# Patient Record
Sex: Male | Born: 1990 | Race: Black or African American | Hispanic: No | Marital: Married | Smoking: Never smoker
Health system: Southern US, Community
[De-identification: ages and names within clinical notes are randomized; demographics above are authoritative.]

## PROBLEM LIST (undated history)

## (undated) DIAGNOSIS — N200 Calculus of kidney: Secondary | ICD-10-CM

## (undated) HISTORY — DX: Calculus of kidney: N20.0

---

## 2019-06-22 ENCOUNTER — Ambulatory Visit: Payer: Self-pay | Attending: Internal Medicine

## 2019-07-29 ENCOUNTER — Ambulatory Visit: Payer: Self-pay | Attending: Internal Medicine

## 2019-07-29 DIAGNOSIS — Z20822 Contact with and (suspected) exposure to covid-19: Secondary | ICD-10-CM | POA: Insufficient documentation

## 2019-07-30 LAB — NOVEL CORONAVIRUS, NAA: SARS-CoV-2, NAA: NOT DETECTED

## 2019-09-21 ENCOUNTER — Other Ambulatory Visit: Payer: Self-pay

## 2019-09-21 ENCOUNTER — Ambulatory Visit (HOSPITAL_COMMUNITY)
Admission: EM | Admit: 2019-09-21 | Discharge: 2019-09-21 | Disposition: A | Discharge status: Home / Self Care | Payer: HRSA Program | Attending: Family Medicine | Admitting: Family Medicine

## 2019-09-21 ENCOUNTER — Encounter (HOSPITAL_COMMUNITY): Payer: Self-pay

## 2019-09-21 DIAGNOSIS — Z20822 Contact with and (suspected) exposure to covid-19: Secondary | ICD-10-CM | POA: Diagnosis not present

## 2019-09-21 DIAGNOSIS — R519 Headache, unspecified: Secondary | ICD-10-CM | POA: Diagnosis present

## 2019-09-21 DIAGNOSIS — Z91030 Bee allergy status: Secondary | ICD-10-CM | POA: Diagnosis not present

## 2019-09-21 DIAGNOSIS — Z880 Allergy status to penicillin: Secondary | ICD-10-CM | POA: Diagnosis not present

## 2019-09-21 DIAGNOSIS — G43011 Migraine without aura, intractable, with status migrainosus: Secondary | ICD-10-CM | POA: Diagnosis not present

## 2019-09-21 MED ORDER — DIPHENHYDRAMINE HCL 25 MG PO CAPS
25.0000 mg | ORAL_CAPSULE | Freq: Once | ORAL | Status: AC
  Administered 2019-09-21: 25 mg via ORAL

## 2019-09-21 MED ORDER — KETOROLAC TROMETHAMINE 30 MG/ML IJ SOLN
30.0000 mg | Freq: Once | INTRAMUSCULAR | Status: AC
  Administered 2019-09-21: 30 mg via INTRAMUSCULAR

## 2019-09-21 MED ORDER — DIPHENHYDRAMINE HCL 25 MG PO CAPS
ORAL_CAPSULE | ORAL | Status: AC
  Filled 2019-09-21: qty 1

## 2019-09-21 MED ORDER — KETOROLAC TROMETHAMINE 30 MG/ML IJ SOLN
INTRAMUSCULAR | Status: AC
  Filled 2019-09-21: qty 1

## 2019-09-21 NOTE — ED Triage Notes (Signed)
Pt c/o generalized HA x3 days, sinus congestion-but pt states he has a h/o "allergies'.  Pt states he has had "high blood pressure the past couple days", but reports home measurement of 147/60.

## 2019-09-21 NOTE — Discharge Instructions (Addendum)
You have a migraine headache. You have gotten Toradol 30mg  shot for pain, as well as benadryl to help with the headache.   Follow up with primary care if not feeling better by tomorrow.

## 2019-09-21 NOTE — ED Provider Notes (Signed)
[place]    CSN: [id number] Arrival date & time: 09/21/19  1812      History   Chief Complaint Chief Complaint  Patient presents with  . Headache    HPI Walter Mendoza. is a 29 y.o. male.   Patient reports that he has been having headaches for the last 3 days.  Patient denies attempting any treatments at home.  Patient reports that he has seasonal allergies.  Per chart review, patient has no significant medical history.  Patient denies history of migraines.  Denies taking any medications for these at home as well.  Patient denies cough, fever, shortness of breath, sore throat, ear pain, chest tightness, nausea, vomiting, diarrhea, rash, fever, other symptoms.  The history is provided by the patient.    History reviewed. No pertinent past medical history.  There are no problems to display for this patient.   History reviewed. No pertinent surgical history.     Home Medications    Prior to Admission medications   Medication Sig Start Date End Date Taking? Authorizing Provider  ibuprofen (ADVIL) 600 MG tablet Take 600 mg by mouth every 6 (six) hours as needed.   Yes [provider]    Family History Family History  Problem Relation Age of Onset  . Healthy Mother   . Healthy Father     Social History Social History   Tobacco Use  . Smoking status: Never Smoker  . Smokeless tobacco: Never Used  Substance Use Topics  . Alcohol use: Never  . Drug use: Never     Allergies   Bee venom and Penicillins   Review of Systems Review of Systems  Constitutional: Negative for chills, diaphoresis, fatigue and fever.  HENT: Negative for ear pain, sinus pressure, sinus pain and sore throat.   Eyes: Negative for pain and visual disturbance.  Respiratory: Negative for cough, shortness of breath and wheezing.   Cardiovascular: Negative for chest pain and palpitations.  Gastrointestinal: Negative for abdominal pain and vomiting.  Genitourinary:  Negative for dysuria and hematuria.  Musculoskeletal: Negative for arthralgias, back pain and myalgias.  Skin: Negative for color change and rash.  Neurological: Positive for headaches. Negative for dizziness, tremors, seizures, syncope, weakness, light-headedness and numbness.  Hematological: Negative.   Psychiatric/Behavioral: Negative.   All other systems reviewed and are negative.    Physical Exam Triage Vital Signs ED Triage Vitals  Enc Vitals Group     BP      Pulse      Resp      Temp      Temp src      SpO2      Weight      Height      Head Circumference      Peak Flow      Pain Score      Pain Loc      Pain Edu?      Excl. in [place]?    No data found.  Updated Vital Signs BP 135/86 (BP Location: Left Arm)   Pulse 62   Temp 99 F (37.2 C) (Oral)   Resp 18   SpO2 100%   Visual Acuity Right Eye Distance:   Left Eye Distance:   Bilateral Distance:    Right Eye Near:   Left Eye Near:    Bilateral Near:     Physical Exam Vitals and nursing note reviewed.  Constitutional:      General: He is not in acute distress.  Appearance: He is well-developed and normal weight. He is not ill-appearing.  HENT:     Head: Normocephalic and atraumatic.     Mouth/Throat:     Mouth: Mucous membranes are moist.  Eyes:     Extraocular Movements: Extraocular movements intact.     Conjunctiva/sclera: Conjunctivae normal.     Pupils: Pupils are equal, round, and reactive to light.  Cardiovascular:     Rate and Rhythm: Normal rate and regular rhythm.     Heart sounds: Normal heart sounds. No murmur.  Pulmonary:     Effort: Pulmonary effort is normal. No respiratory distress.     Breath sounds: Normal breath sounds. No stridor. No wheezing, rhonchi or rales.  Chest:     Chest wall: No tenderness.  Abdominal:     Palpations: Abdomen is soft.     Tenderness: There is no abdominal tenderness.  Musculoskeletal:        General: Normal range of motion.     Cervical back:  Normal range of motion and neck supple.  Skin:    General: Skin is warm and dry.     Capillary Refill: Capillary refill takes less than 2 seconds.  Neurological:     Mental Status: He is alert and oriented to person, place, and time.     Cranial Nerves: No cranial nerve deficit.     Sensory: No sensory deficit.     Motor: No weakness.     Coordination: Romberg sign negative. Coordination normal.     Gait: Gait normal.  Psychiatric:        Mood and Affect: Mood normal.        Speech: Speech normal.        Behavior: Behavior normal.      UC Treatments / Results  Labs (all labs ordered are listed, but only abnormal results are displayed) Labs Reviewed  SARS CORONAVIRUS 2 (TAT 6-24 HRS)    EKG   Radiology No results found.  Procedures Procedures (including critical care time)  Medications Ordered in UC Medications  ketorolac (TORADOL) 30 MG/ML injection 30 mg (30 mg Intramuscular Given 09/21/19 1920)  diphenhydrAMINE (BENADRYL) capsule 25 mg (25 mg Oral Given 09/21/19 1919)    Initial Impression / Assessment and Plan / UC Course  I have reviewed the triage vital signs and the nursing notes.  Pertinent labs & imaging results that were available during my care of the patient were reviewed by me and considered in my medical decision making (see chart for details).     Patient presents with headache for the last 3 days.  Not photophobic or phonophobic.  Pupils equal reactive to light, EOMs intact, no cranial deficit noted.  No concern for CVA today.  Likely migraine, patient received 30 mg Toradol IM in office today, along with Benadryl 25 mg.  Patient instructed to go home and rest and quiet dark room.  Patient instructed to follow-up.  This office or with primary care tomorrow in the morning if not feeling better.  Patient instructed to go to the ER for trouble swallowing, trouble breathing, unrelenting head pain, worst headache of his life, thunderclap headache, other  concerning symptoms. Final Clinical Impressions(s) / UC Diagnoses   Final diagnoses:  Intractable migraine without aura and with status migrainosus     Discharge Instructions     You have a migraine headache. You have gotten Toradol 30mg  shot for pain, as well as benadryl to help with the headache.   Follow up with primary  care if not feeling better by tomorrow.    ED Prescriptions    None     PDMP not reviewed this encounter.   Moshe Cipro, NP 09/23/19 [phone]

## 2019-09-22 LAB — SARS CORONAVIRUS 2 (TAT 6-24 HRS): SARS Coronavirus 2: NEGATIVE

## 2020-02-12 ENCOUNTER — Other Ambulatory Visit: Payer: Self-pay

## 2020-04-21 ENCOUNTER — Other Ambulatory Visit: Payer: Self-pay

## 2020-04-21 DIAGNOSIS — U071 COVID-19: Secondary | ICD-10-CM

## 2020-04-22 LAB — NOVEL CORONAVIRUS, NAA: SARS-CoV-2, NAA: NOT DETECTED

## 2020-04-22 LAB — SPECIMEN STATUS REPORT

## 2020-04-22 LAB — SARS-COV-2, NAA 2 DAY TAT

## 2020-07-12 ENCOUNTER — Encounter (HOSPITAL_COMMUNITY): Payer: Self-pay | Admitting: Emergency Medicine

## 2020-07-12 ENCOUNTER — Other Ambulatory Visit: Payer: Self-pay

## 2020-07-12 ENCOUNTER — Emergency Department (HOSPITAL_COMMUNITY): Payer: Medicaid - Out of State

## 2020-07-12 ENCOUNTER — Emergency Department (HOSPITAL_COMMUNITY)
Admission: EM | Admit: 2020-07-12 | Discharge: 2020-07-12 | Disposition: A | Discharge status: Home / Self Care | Payer: Medicaid - Out of State | Attending: Emergency Medicine | Admitting: Emergency Medicine

## 2020-07-12 DIAGNOSIS — R1013 Epigastric pain: Secondary | ICD-10-CM | POA: Diagnosis not present

## 2020-07-12 DIAGNOSIS — R112 Nausea with vomiting, unspecified: Secondary | ICD-10-CM | POA: Diagnosis present

## 2020-07-12 LAB — COMPREHENSIVE METABOLIC PANEL
ALT: 21 U/L (ref 0–44)
AST: 18 U/L (ref 15–41)
Albumin: 3.8 g/dL (ref 3.5–5.0)
Alkaline Phosphatase: 46 U/L (ref 38–126)
Anion gap: 9 (ref 5–15)
BUN: 11 mg/dL (ref 6–20)
CO2: 26 mmol/L (ref 22–32)
Calcium: 9.2 mg/dL (ref 8.9–10.3)
Chloride: 99 mmol/L (ref 98–111)
Creatinine, Ser: 1.37 mg/dL — ABNORMAL HIGH (ref 0.61–1.24)
GFR, Estimated: >60 mL/min (ref >60)
Glucose, Bld: 97 mg/dL (ref 70–99)
Potassium: 3.8 mmol/L (ref 3.5–5.1)
Sodium: 134 mmol/L — ABNORMAL LOW (ref 135–145)
Total Bilirubin: 1.1 mg/dL (ref 0.3–1.2)
Total Protein: 7.3 g/dL (ref 6.5–8.1)

## 2020-07-12 LAB — CBC WITH DIFFERENTIAL/PLATELET
Abs Immature Granulocytes: 0.03 10*3/uL (ref 0.00–0.07)
Basophils Absolute: 0 10*3/uL (ref 0.0–0.1)
Basophils Relative: 0 %
Eosinophils Absolute: 0.2 10*3/uL (ref 0.0–0.5)
Eosinophils Relative: 2 %
HCT: 43.9 % (ref 39.0–52.0)
Hemoglobin: 14.6 g/dL (ref 13.0–17.0)
Immature Granulocytes: 0 %
Lymphocytes Relative: 19 %
Lymphs Abs: 2 10*3/uL (ref 0.7–4.0)
MCH: 30.1 pg (ref 26.0–34.0)
MCHC: 33.3 g/dL (ref 30.0–36.0)
MCV: 90.5 fL (ref 80.0–100.0)
Monocytes Absolute: 0.6 10*3/uL (ref 0.1–1.0)
Monocytes Relative: 6 %
Neutro Abs: 7.8 10*3/uL — ABNORMAL HIGH (ref 1.7–7.7)
Neutrophils Relative %: 73 %
Platelets: 274 10*3/uL (ref 150–400)
RBC: 4.85 MIL/uL (ref 4.22–5.81)
RDW: 11.2 % — ABNORMAL LOW (ref 11.5–15.5)
WBC: 10.7 10*3/uL — ABNORMAL HIGH (ref 4.0–10.5)
nRBC: 0 % (ref 0.0–0.2)

## 2020-07-12 LAB — URINALYSIS, ROUTINE W REFLEX MICROSCOPIC
Bacteria, UA: NONE SEEN
Bilirubin Urine: NEGATIVE
Glucose, UA: NEGATIVE mg/dL
Hgb urine dipstick: NEGATIVE
Ketones, ur: 5 mg/dL — AB
Leukocytes,Ua: NEGATIVE
Nitrite: NEGATIVE
Protein, ur: 30 mg/dL — AB
Specific Gravity, Urine: 1.03 (ref 1.005–1.030)
pH: 5 (ref 5.0–8.0)

## 2020-07-12 LAB — LIPASE, BLOOD: Lipase: 18 U/L (ref 11–51)

## 2020-07-12 MED ORDER — IOHEXOL 300 MG/ML  SOLN
100.0000 mL | Freq: Once | INTRAMUSCULAR | Status: AC | PRN
  Administered 2020-07-12: 100 mL via INTRAVENOUS

## 2020-07-12 MED ORDER — SODIUM CHLORIDE 0.9 % IV BOLUS
1000.0000 mL | Freq: Once | INTRAVENOUS | Status: AC
  Administered 2020-07-12: 1000 mL via INTRAVENOUS

## 2020-07-12 MED ORDER — FAMOTIDINE IN NACL 20-0.9 MG/50ML-% IV SOLN
20.0000 mg | Freq: Once | INTRAVENOUS | Status: AC
  Administered 2020-07-12: 20 mg via INTRAVENOUS
  Filled 2020-07-12: qty 50

## 2020-07-12 MED ORDER — HALOPERIDOL LACTATE 5 MG/ML IJ SOLN
1.0000 mg | Freq: Once | INTRAMUSCULAR | Status: AC
  Administered 2020-07-12: 1 mg via INTRAVENOUS
  Filled 2020-07-12: qty 1

## 2020-07-12 MED ORDER — METOCLOPRAMIDE HCL 10 MG PO TABS: 10.0000 mg | ORAL_TABLET | Freq: Three times a day (TID) | ORAL | 0 refills | Status: DC | PRN

## 2020-07-12 NOTE — ED Notes (Signed)
Patient transported to CT 

## 2020-07-12 NOTE — Discharge Instructions (Signed)
I prescribed Reglan a medicine to take only for severe nausea and vomiting episodes.  Otherwise you can take the zofran you have at home.  Stick to fluids for the next 2 days - soups, smoothies, light foods.  Ease back into regular foods.  Avoid greasy foods or red meats and fast food for the next week  Call to make an appointment with a stomach specialist (GI) doctor at the number above.

## 2020-07-12 NOTE — ED Triage Notes (Signed)
Pt here from home with c/o abd pain and n/v for about 6 days , no covid exposures, same thing happened last year per pt , no fever

## 2020-07-12 NOTE — ED Provider Notes (Signed)
[facility] EMERGENCY DEPARTMENT Provider Note  CSN: [id number] Arrival date & time: 07/12/20  0841    History CC: Nausea, vomiting  Walter Huy. is a 30 y.o. male here with abdominal pain.  He reports epigastric discomfort onset 5 days ago, gnawing and sharp pain, that he has had before in the past but never this persistent.  He reports it comes and goes but never leaves completely.  Nothing makes it better or worse.  He reports some loose BM the past few days.  He is passing gas.  He has had significant nausea and recurrent vomiting in the past 2 days.    He has never had a workup for these episodes in the past.  Does not see GI for this.  Reports no sig medical history.  NKDA.  No abdominal surgical hx.  No fam hx of crohn's, IBD.  He denies marijuana or any other illicit drug use  No fevers, chills, cough, congestion, headache, sore throat.  He has not had the covid vaccines.  HPI     History reviewed. No pertinent past medical history.  There are no problems to display for this patient.   History reviewed. No pertinent surgical history.     Family History  Problem Relation Age of Onset  . Healthy Mother   . Healthy Father     Social History   Tobacco Use  . Smoking status: Never Smoker  . Smokeless tobacco: Never Used  Vaping Use  . Vaping Use: Never used  Substance Use Topics  . Alcohol use: Never  . Drug use: Never    Home Medications Prior to Admission medications   Medication Sig Start Date End Date Taking? Authorizing Provider  metoCLOPramide (REGLAN) 10 MG tablet Take 1 tablet (10 mg total) by mouth every 8 (eight) hours as needed for up to 15 doses for nausea or vomiting. 07/12/20  Yes Terald Sleeper, MD  ibuprofen (ADVIL) 600 MG tablet Take 600 mg by mouth every 6 (six) hours as needed.    [provider]    Allergies    Bee venom and Penicillins  Review of Systems   Review of Systems  Constitutional: Negative  for chills and fever.  Eyes: Negative for pain and visual disturbance.  Respiratory: Negative for cough and shortness of breath.   Cardiovascular: Negative for chest pain and palpitations.  Gastrointestinal: Positive for abdominal pain, nausea and vomiting.  Genitourinary: Negative for dysuria and hematuria.  Musculoskeletal: Negative for arthralgias and back pain.  Skin: Negative for color change and rash.  Neurological: Negative for syncope and headaches.  All other systems reviewed and are negative.   Physical Exam Updated Vital Signs BP (!) 139/99   Pulse 64   Temp 98.6 F (37 C) (Oral)   Resp (!) 22   Ht 6\' 2"  (1.88 m)   Wt 97.5 kg   SpO2 100%   BMI 27.60 kg/m   Physical Exam Constitutional:      General: He is not in acute distress. HENT:     Head: Normocephalic and atraumatic.  Eyes:     Conjunctiva/sclera: Conjunctivae normal.     Pupils: Pupils are equal, round, and reactive to light.  Cardiovascular:     Rate and Rhythm: Normal rate and regular rhythm.  Pulmonary:     Effort: Pulmonary effort is normal. No respiratory distress.  Abdominal:     General: There is no distension.     Palpations: There is no mass.  Tenderness: There is abdominal tenderness in the epigastric area. There is no guarding.     Hernia: No hernia is present.  Skin:    General: Skin is warm and dry.  Neurological:     General: No focal deficit present.     Mental Status: He is alert. Mental status is at baseline.  Psychiatric:        Mood and Affect: Mood normal.        Behavior: Behavior normal.     ED Results / Procedures / Treatments   Labs (all labs ordered are listed, but only abnormal results are displayed) Labs Reviewed  COMPREHENSIVE METABOLIC PANEL - Abnormal; Notable for the following components:      Result Value   Sodium 134 (*)    Creatinine, Ser 1.37 (*)    All other components within normal limits  CBC WITH DIFFERENTIAL/PLATELET - Abnormal; Notable for the  following components:   WBC 10.7 (*)    RDW 11.2 (*)    Neutro Abs 7.8 (*)    All other components within normal limits  URINALYSIS, ROUTINE W REFLEX MICROSCOPIC - Abnormal; Notable for the following components:   Color, Urine AMBER (*)    Ketones, ur 5 (*)    Protein, ur 30 (*)    All other components within normal limits  LIPASE, BLOOD    EKG None  Radiology CT ABDOMEN PELVIS W CONTRAST  Result Date: 07/12/2020 CLINICAL DATA:  Bowel obstruction suspected, left-sided abdominal pain with nausea and vomiting for 6 days EXAM: CT ABDOMEN AND PELVIS WITH CONTRAST TECHNIQUE: Multidetector CT imaging of the abdomen and pelvis was performed using the standard protocol following bolus administration of intravenous contrast. CONTRAST:  OMNIPAQUE IOHEXOL 300 MG/ML  SOLN COMPARISON:  None. FINDINGS: Lower chest: No acute abnormality. Hepatobiliary: No solid liver abnormality is seen. No gallstones, gallbladder wall thickening, or biliary dilatation. Pancreas: Unremarkable. No pancreatic ductal dilatation or surrounding inflammatory changes. Spleen: Normal in size without significant abnormality. Adrenals/Urinary Tract: Adrenal glands are unremarkable. Nonobstructive calculus of the posterior midportion of the right kidney. No left-sided calculi, ureteral calculi, or hydronephrosis. Bladder is unremarkable. Stomach/Bowel: Stomach is within normal limits. Appendix appears normal. No evidence of bowel wall thickening, distention, or inflammatory changes. Vascular/Lymphatic: No significant vascular findings are present. No enlarged abdominal or pelvic lymph nodes. Reproductive: No mass or other significant abnormality. Other: No abdominal wall hernia or abnormality. No abdominopelvic ascites. Musculoskeletal: No acute or significant osseous findings. IMPRESSION: 1. No acute CT findings of the abdomen or pelvis to explain left-sided abdominal pain, nausea, vomiting, or vomiting. 2. Nonobstructive right  nephrolithiasis. Electronically Signed   By: Lauralyn Primes M.D.   On: 07/12/2020 12:59    Procedures Procedures (including critical care time)  Medications Ordered in ED Medications  haloperidol lactate (HALDOL) injection 1 mg (1 mg Intravenous Given 07/12/20 1221)  famotidine (PEPCID) IVPB 20 mg premix (0 mg Intravenous Stopped 07/12/20 1326)  sodium chloride 0.9 % bolus 1,000 mL (0 mLs Intravenous Stopped 07/12/20 1343)  iohexol (OMNIPAQUE) 300 MG/ML solution 100 mL (100 mLs Intravenous Contrast Given 07/12/20 1249)    ED Course  I have reviewed the triage vital signs and the nursing notes.  Pertinent labs & imaging results that were available during my care of the patient were reviewed by me and considered in my medical decision making (see chart for details).  This patient presents to the Emergency Department with complaint of abdominal pain. This involves an extensive number of treatment  options, and is a complaint that carries with it a high risk of complications and morbidity.  The differential diagnosis includes gastritis vs constipation vs inflammatory bowel disease vs intraabdominal infection vs other  I ordered, reviewed, and interpreted labs, WBC 10.7, CMP with Cr 1.37 (no prior baseline), otherwise unremarkable.  UA without evidence of infection.  Lipase and LFT's wnl.   I ordered medication IV haldol, IV fluids, and IV pepcid for abdominal pain and/or nausea I ordered imaging studies which included CT abdomen/pelvis I independently visualized and interpreted imaging which showed no acute processes   After the interventions stated above, I reevaluated the patient and found that they remained clinically stable.  Based on the patient's clinical exam, vital signs, risk factors, and ED testing, I felt that the patient's overall risk of life-threatening emergency such as bowel perforation, surgical emergency, or sepsis was quite low.  I suspect this clinical presentation is most  consistent with gastroparesis or gastritis, but explained to the patient that this evaluation was not a definitive diagnostic workup.  I discussed outpatient follow up with primary care provider, and provided specialist office number on the patient's discharge paper if a referral was deemed necessary.  I discussed return precautions with the patient. I felt the patient was clinically stable for discharge.   Clinical Course as of 07/12/20 1648  Tue Jul 12, 2020  1304 CT reviewed -unremarkable.  Suspect this may be gastritis or gastroparesis.  Will PO challenge [MT]  1422 Pt feels significantly better, tolerated PO [MT]    Clinical Course User Index [MT] Corrigan Kretschmer, Kermit Balo, MD    Final Clinical Impression(s) / ED Diagnoses Final diagnoses:  Non-intractable vomiting with nausea, unspecified vomiting type    Rx / DC Orders ED Discharge Orders         Ordered    metoCLOPramide (REGLAN) 10 MG tablet  Every 8 hours PRN        07/12/20 1424           Terald Sleeper, MD 07/12/20 1649

## 2020-07-14 ENCOUNTER — Encounter: Payer: Self-pay | Admitting: Nurse Practitioner

## 2020-07-28 ENCOUNTER — Ambulatory Visit: Payer: Medicaid - Out of State | Admitting: Nurse Practitioner

## 2020-09-09 ENCOUNTER — Emergency Department (HOSPITAL_BASED_OUTPATIENT_CLINIC_OR_DEPARTMENT_OTHER): Payer: Medicaid - Out of State

## 2020-09-09 ENCOUNTER — Encounter (HOSPITAL_BASED_OUTPATIENT_CLINIC_OR_DEPARTMENT_OTHER): Payer: Self-pay

## 2020-09-09 ENCOUNTER — Other Ambulatory Visit: Payer: Self-pay

## 2020-09-09 ENCOUNTER — Emergency Department (HOSPITAL_BASED_OUTPATIENT_CLINIC_OR_DEPARTMENT_OTHER)
Admission: EM | Admit: 2020-09-09 | Discharge: 2020-09-10 | Disposition: A | Discharge status: Home / Self Care | Payer: Medicaid - Out of State | Attending: Emergency Medicine | Admitting: Emergency Medicine

## 2020-09-09 DIAGNOSIS — R7989 Other specified abnormal findings of blood chemistry: Secondary | ICD-10-CM

## 2020-09-09 DIAGNOSIS — R1032 Left lower quadrant pain: Secondary | ICD-10-CM | POA: Insufficient documentation

## 2020-09-09 DIAGNOSIS — N200 Calculus of kidney: Secondary | ICD-10-CM

## 2020-09-09 DIAGNOSIS — R109 Unspecified abdominal pain: Secondary | ICD-10-CM

## 2020-09-09 LAB — CBC
HCT: 42.1 % (ref 39.0–52.0)
Hemoglobin: 14.2 g/dL (ref 13.0–17.0)
MCH: 30.2 pg (ref 26.0–34.0)
MCHC: 33.7 g/dL (ref 30.0–36.0)
MCV: 89.6 fL (ref 80.0–100.0)
Platelets: 245 10*3/uL (ref 150–400)
RBC: 4.7 MIL/uL (ref 4.22–5.81)
RDW: 11.7 % (ref 11.5–15.5)
WBC: 10.7 10*3/uL — ABNORMAL HIGH (ref 4.0–10.5)
nRBC: 0 % (ref 0.0–0.2)

## 2020-09-09 LAB — URINALYSIS, ROUTINE W REFLEX MICROSCOPIC
Bilirubin Urine: NEGATIVE
Glucose, UA: NEGATIVE mg/dL
Ketones, ur: 15 mg/dL — AB
Leukocytes,Ua: NEGATIVE
Nitrite: NEGATIVE
RBC / HPF: >50 RBC/hpf — ABNORMAL HIGH (ref 0–5)
Specific Gravity, Urine: 1.022 (ref 1.005–1.030)
pH: 7 (ref 5.0–8.0)

## 2020-09-09 LAB — COMPREHENSIVE METABOLIC PANEL
ALT: 16 U/L (ref 0–44)
AST: 17 U/L (ref 15–41)
Albumin: 4.3 g/dL (ref 3.5–5.0)
Alkaline Phosphatase: 52 U/L (ref 38–126)
Anion gap: 11 (ref 5–15)
BUN: 13 mg/dL (ref 6–20)
CO2: 26 mmol/L (ref 22–32)
Calcium: 9.8 mg/dL (ref 8.9–10.3)
Chloride: 103 mmol/L (ref 98–111)
Creatinine, Ser: 1.34 mg/dL — ABNORMAL HIGH (ref 0.61–1.24)
GFR, Estimated: >60 mL/min (ref >60)
Glucose, Bld: 112 mg/dL — ABNORMAL HIGH (ref 70–99)
Potassium: 3.2 mmol/L — ABNORMAL LOW (ref 3.5–5.1)
Sodium: 140 mmol/L (ref 135–145)
Total Bilirubin: 0.4 mg/dL (ref 0.3–1.2)
Total Protein: 7.4 g/dL (ref 6.5–8.1)

## 2020-09-09 MED ORDER — MORPHINE SULFATE (PF) 4 MG/ML IV SOLN
4.0000 mg | Freq: Once | INTRAVENOUS | Status: AC
  Administered 2020-09-09: 4 mg via INTRAVENOUS
  Filled 2020-09-09: qty 1

## 2020-09-09 MED ORDER — ONDANSETRON HCL 4 MG/2ML IJ SOLN
4.0000 mg | Freq: Once | INTRAMUSCULAR | Status: AC
  Administered 2020-09-09: 4 mg via INTRAVENOUS
  Filled 2020-09-09: qty 2

## 2020-09-09 MED ORDER — SODIUM CHLORIDE 0.9 % IV BOLUS
1000.0000 mL | Freq: Once | INTRAVENOUS | Status: AC
  Administered 2020-09-09: 1000 mL via INTRAVENOUS

## 2020-09-09 NOTE — ED Provider Notes (Signed)
[facility] EMERGENCY DEPT Provider Note   CSN: [id number] Arrival date & time: 09/09/20  2217     History Chief Complaint  Patient presents with  . Abdominal Pain    Walter Mendoza. is a 30 y.o. male.  30 yo M with a chief complaints of left-sided abdominal pain.  This is in his side and his left lower quadrant.  Started suddenly while he was at work.  He tried moving his bowels and urinating but without improvement.  Nothing seems to make it better.  Nothing seems to make it worse.  No fevers no urinary symptoms.  Had something like this happen to him previously and resolved spontaneously.  Had a CT scan at that time that was unremarkable.  No history of kidney stones.  Denies pain to the penis or testicles.  The history is provided by the patient.  Abdominal Pain Pain location:  L flank Pain quality: sharp and shooting   Pain radiates to:  Does not radiate Pain severity:  Moderate Onset quality:  Sudden Duration:  2 hours Timing:  Constant Progression:  Unchanged Chronicity:  Recurrent Relieved by:  Nothing Worsened by:  Nothing Ineffective treatments:  None tried Associated symptoms: no chest pain, no chills, no diarrhea, no fever, no nausea, no shortness of breath and no vomiting        History reviewed. No pertinent past medical history.  There are no problems to display for this patient.   History reviewed. No pertinent surgical history.     Family History  Problem Relation Age of Onset  . Healthy Mother   . Healthy Father     Social History   Tobacco Use  . Smoking status: Never Smoker  . Smokeless tobacco: Never Used  Vaping Use  . Vaping Use: Never used  Substance Use Topics  . Alcohol use: Never  . Drug use: Never    Home Medications Prior to Admission medications   Medication Sig Start Date End Date Taking? Authorizing Provider  ibuprofen (ADVIL) 600 MG tablet Take 600 mg by mouth every 6 (six) hours as needed.    [provider]  metoCLOPramide (REGLAN) 10 MG tablet Take 1 tablet (10 mg total) by mouth every 8 (eight) hours as needed for up to 15 doses for nausea or vomiting. 07/12/20   Terald Sleeper, MD    Allergies    Bee venom and Penicillins  Review of Systems   Review of Systems  Constitutional: Negative for chills and fever.  HENT: Negative for congestion and facial swelling.   Eyes: Negative for discharge and visual disturbance.  Respiratory: Negative for shortness of breath.   Cardiovascular: Negative for chest pain and palpitations.  Gastrointestinal: Positive for abdominal pain. Negative for diarrhea, nausea and vomiting.  Musculoskeletal: Negative for arthralgias and myalgias.  Skin: Negative for color change and rash.  Neurological: Negative for tremors, syncope and headaches.  Psychiatric/Behavioral: Negative for confusion and dysphoric mood.    Physical Exam Updated Vital Signs BP (!) 157/94 (BP Location: Left Arm)   Pulse 71   Temp 98.6 F (37 C) (Oral)   Resp (!) 22   Ht 6\' 2"  (1.88 m)   Wt 97.5 kg   SpO2 99%   BMI 27.60 kg/m   Physical Exam Vitals and nursing note reviewed.  Constitutional:      Appearance: He is well-developed.  HENT:     Head: Normocephalic and atraumatic.  Eyes:     Pupils: Pupils are equal,  round, and reactive to light.  Neck:     Vascular: No JVD.  Cardiovascular:     Rate and Rhythm: Normal rate and regular rhythm.     Heart sounds: No murmur heard. No friction rub. No gallop.   Pulmonary:     Effort: No respiratory distress.     Breath sounds: No wheezing.  Abdominal:     General: There is no distension.     Tenderness: There is abdominal tenderness (llq). There is no guarding or rebound.  Musculoskeletal:        General: Normal range of motion.     Cervical back: Normal range of motion and neck supple.  Skin:    Coloration: Skin is not pale.     Findings: No rash.  Neurological:     Mental Status: He is alert and oriented  to person, place, and time.  Psychiatric:        Behavior: Behavior normal.     ED Results / Procedures / Treatments   Labs (all labs ordered are listed, but only abnormal results are displayed) Labs Reviewed  COMPREHENSIVE METABOLIC PANEL - Abnormal; Notable for the following components:      Result Value   Potassium 3.2 (*)    Glucose, Bld 112 (*)    Creatinine, Ser 1.34 (*)    All other components within normal limits  CBC - Abnormal; Notable for the following components:   WBC 10.7 (*)    All other components within normal limits  URINALYSIS, ROUTINE W REFLEX MICROSCOPIC    EKG None  Radiology No results found.  Procedures Procedures   Medications Ordered in ED Medications  sodium chloride 0.9 % bolus 1,000 mL (1,000 mLs Intravenous New Bag/Given 09/09/20 2306)  morphine 4 MG/ML injection 4 mg (4 mg Intravenous Given 09/09/20 2240)  ondansetron (ZOFRAN) injection 4 mg (4 mg Intravenous Given 09/09/20 2240)    ED Course  I have reviewed the triage vital signs and the nursing notes.  Pertinent labs & imaging results that were available during my care of the patient were reviewed by me and considered in my medical decision making (see chart for details).    MDM Rules/Calculators/A&P                          30 yo M with a chief complaint of left flank pain.  Started suddenly about an hour ago.  Patient looks very uncomfortable on exam.  Does have some mild left lower quadrant abdominal pain.  Will obtain a stone study.  UA.  Blood work fluids and pain medicine.  Reassess.  Signed out to Dr. Fredderick Phenix, please see her note for further details care in the ED.  The patients results and plan were reviewed and discussed.   Any x-rays performed were independently reviewed by myself.   Differential diagnosis were considered with the presenting HPI.  Medications  sodium chloride 0.9 % bolus 1,000 mL (1,000 mLs Intravenous New Bag/Given 09/09/20 2306)  morphine 4 MG/ML  injection 4 mg (4 mg Intravenous Given 09/09/20 2240)  ondansetron (ZOFRAN) injection 4 mg (4 mg Intravenous Given 09/09/20 2240)    Vitals:   09/09/20 2227 09/09/20 2228  BP: (!) 157/94   Pulse: 71   Resp: (!) 22   Temp: 98.6 F (37 C)   TempSrc: Oral   SpO2: 99%   Weight:  97.5 kg  Height:  6\' 2"  (1.88 m)    Final diagnoses:  Left flank pain      Final Clinical Impression(s) / ED Diagnoses Final diagnoses:  Left flank pain    Rx / DC Orders ED Discharge Orders    None       Melene Plan, DO 09/09/20 2312

## 2020-09-09 NOTE — ED Triage Notes (Signed)
Pt here POV from Home with ABD Pain.   Pt was at work 1-2 PTA and pain began randomly. Pain is Left-Sided. No other symptoms besides acute onset of pain. No recent trauma.   Ambulatory. GCS 15.

## 2020-09-10 MED ORDER — HYDROCODONE-ACETAMINOPHEN 5-325 MG PO TABS: 1.0000 | ORAL_TABLET | Freq: Four times a day (QID) | ORAL | 0 refills | Status: DC | PRN

## 2020-09-10 MED ORDER — TAMSULOSIN HCL 0.4 MG PO CAPS: 0.4000 mg | ORAL_CAPSULE | Freq: Every day | ORAL | 0 refills | Status: DC

## 2020-09-10 NOTE — ED Provider Notes (Signed)
Care was taken over from Dr. Adela Lank.  Patient presented with left flank pain.  CT scan shows a punctate stone in the distal left ureter.  There is some mild hydronephrosis.  His urinalysis does not appear infected.  His creatinine is mildly elevated.  I discussed this with him and that he will need to follow-up with a primary care doctor regarding this.  His other labs are nonconcerning.  He was discharged home in good condition.  He was given a prescription for Vicodin and Flomax.  He was given a referral to follow-up with urology.  Return precautions were given.  Results for orders placed or performed during the hospital encounter of 09/09/20  Comprehensive metabolic panel  Result Value Ref Range   Sodium 140 135 - 145 mmol/L   Potassium 3.2 (L) 3.5 - 5.1 mmol/L   Chloride 103 98 - 111 mmol/L   CO2 26 22 - 32 mmol/L   Glucose, Bld 112 (H) 70 - 99 mg/dL   BUN 13 6 - 20 mg/dL   Creatinine, Ser 3.29 (H) 0.61 - 1.24 mg/dL   Calcium 9.8 8.9 - 51.8 mg/dL   Total Protein 7.4 6.5 - 8.1 g/dL   Albumin 4.3 3.5 - 5.0 g/dL   AST 17 15 - 41 U/L   ALT 16 0 - 44 U/L   Alkaline Phosphatase 52 38 - 126 U/L   Total Bilirubin 0.4 0.3 - 1.2 mg/dL   GFR, Estimated >84 >16 mL/min   Anion gap 11 5 - 15  CBC  Result Value Ref Range   WBC 10.7 (H) 4.0 - 10.5 K/uL   RBC 4.70 4.22 - 5.81 MIL/uL   Hemoglobin 14.2 13.0 - 17.0 g/dL   HCT 60.6 30.1 - 60.1 %   MCV 89.6 80.0 - 100.0 fL   MCH 30.2 26.0 - 34.0 pg   MCHC 33.7 30.0 - 36.0 g/dL   RDW 09.3 23.5 - 57.3 %   Platelets 245 150 - 400 K/uL   nRBC 0.0 0.0 - 0.2 %  Urinalysis, Routine w reflex microscopic Urine, Clean Catch  Result Value Ref Range   Color, Urine YELLOW YELLOW   APPearance CLEAR CLEAR   Specific Gravity, Urine 1.022 1.005 - 1.030   pH 7.0 5.0 - 8.0   Glucose, UA NEGATIVE NEGATIVE mg/dL   Hgb urine dipstick LARGE (A) NEGATIVE   Bilirubin Urine NEGATIVE NEGATIVE   Ketones, ur 15 (A) NEGATIVE mg/dL   Protein, ur TRACE (A) NEGATIVE mg/dL    Nitrite NEGATIVE NEGATIVE   Leukocytes,Ua NEGATIVE NEGATIVE   RBC / HPF >50 (H) 0 - 5 RBC/hpf   WBC, UA 6-10 0 - 5 WBC/hpf   Mucus PRESENT    Hyaline Casts, UA PRESENT    CT Renal Stone Study  Result Date: 09/09/2020 CLINICAL DATA:  Left-sided flank pain. EXAM: CT ABDOMEN AND PELVIS WITHOUT CONTRAST TECHNIQUE: Multidetector CT imaging of the abdomen and pelvis was performed following the standard protocol without IV contrast. COMPARISON:  CT 07/12/2020 FINDINGS: Lower chest: Lung bases are clear. No consolidation or pleural fluid. Hepatobiliary: No focal liver abnormality is seen. No gallstones, gallbladder wall thickening, or biliary dilatation. Pancreas: No ductal dilatation or inflammation. Spleen: Normal in size without focal abnormality. Adrenals/Urinary Tract: Normal adrenal glands. Mild left hydroureteronephrosis and perinephric edema. There is a punctate obstructing stone in the distal left ureter, series 2, image 73 and series 5, image 41. No definite additional nonobstructing stones in the left kidney. No right hydronephrosis. Nonobstructing 6  mm stone in the lower right kidney. Urinary bladder is nearly empty. No bladder stone. Stomach/Bowel: Unenhanced stomach is unremarkable. There is no small bowel obstruction or inflammation. High-riding cecum in the right mid abdomen. Appendix not confidently visualized on the current exam. No evidence of appendicitis. Small volume of colonic stool. No colonic inflammation. Vascular/Lymphatic: Normal caliber abdominal aorta. No bulky abdominopelvic adenopathy. Reproductive: Prostate is unremarkable. Other: No free air or free fluid. No significant abdominal wall hernia. Musculoskeletal: There are no acute or suspicious osseous abnormalities. Mild chronic bony fragmentation of the pubic symphysis. IMPRESSION: 1. Punctate obstructing stone in the distal left ureter with mild hydronephrosis and perinephric edema. 2. Nonobstructing 6 mm stone in the lower right  kidney. Electronically Signed   By: Narda Rutherford M.D.   On: 09/09/2020 23:09      Rolan Bucco, MD 09/10/20 [phone]

## 2020-09-10 NOTE — Discharge Instructions (Signed)
Follow-up with urologist if your symptoms are not improving within the next few days.  Return here as needed if you have any worsening symptoms including worsening pain, fevers, vomiting, difficulty urinating or other worsening symptoms.  Your creatinine was mildly elevated and needs to be followed by a primary care doctor.

## 2021-03-04 ENCOUNTER — Emergency Department (HOSPITAL_BASED_OUTPATIENT_CLINIC_OR_DEPARTMENT_OTHER)
Admission: EM | Admit: 2021-03-04 | Discharge: 2021-03-04 | Disposition: A | Discharge status: Home / Self Care | Payer: Medicaid - Out of State | Attending: Emergency Medicine | Admitting: Emergency Medicine

## 2021-03-04 ENCOUNTER — Encounter (HOSPITAL_BASED_OUTPATIENT_CLINIC_OR_DEPARTMENT_OTHER): Payer: Self-pay | Admitting: Emergency Medicine

## 2021-03-04 ENCOUNTER — Other Ambulatory Visit: Payer: Self-pay

## 2021-03-04 DIAGNOSIS — Z28310 Unvaccinated for covid-19: Secondary | ICD-10-CM | POA: Insufficient documentation

## 2021-03-04 DIAGNOSIS — M7918 Myalgia, other site: Secondary | ICD-10-CM | POA: Diagnosis present

## 2021-03-04 DIAGNOSIS — U071 COVID-19: Secondary | ICD-10-CM | POA: Insufficient documentation

## 2021-03-04 LAB — RESP PANEL BY RT-PCR (FLU A&B, COVID) ARPGX2
Influenza A by PCR: NEGATIVE
Influenza B by PCR: NEGATIVE
SARS Coronavirus 2 by RT PCR: POSITIVE — AB

## 2021-03-04 NOTE — ED Triage Notes (Signed)
Pt reports subjective fever last night, left lower back pain ,severe. Endorses body aches.

## 2021-03-04 NOTE — ED Notes (Signed)
Dr. Rush Landmark aware that pt is covid positive

## 2021-03-04 NOTE — Discharge Instructions (Addendum)
You have tested POSITIVE for COVID 19 today. It is recommended that you self isolate for the next 5 days. Cleared: 09/15. Afterwards you should wear a mask for an additional 5 days when you are around other individuals.   I would recommend Ibuprofen and Tylenol as needed for pain and fevers. Drink plenty of fluids to stay hydrated. Rest as much as possible.   Return to the ED for any new/worsening symptoms including severe shortness of breath, chest pain, fingers/lips turning blue, passing out, or any other new/concerning symptoms.

## 2021-03-04 NOTE — ED Provider Notes (Signed)
[facility] EMERGENCY DEPT Provider Note   CSN: [id number] Arrival date & time: 03/04/21  1803     History Chief Complaint  Patient presents with   Generalized Body Aches    Walter Mendoza. is a 30 y.o. male who presents to the ED today with complaint of generalized body aches, low back pain, headache, subjective fevers that began last night.  Patient denies any recent sick contacts.  He is unvaccinated for COVID-19.  He has not taken anything for his symptoms.  He decided to come to the ED for further evaluation.  He denies any shortness of breath, chest pain, abdominal pain, nausea, vomiting, diarrhea, any other associated symptoms.  The history is provided by the patient and medical records.      History reviewed. No pertinent past medical history.  There are no problems to display for this patient.   History reviewed. No pertinent surgical history.     Family History  Problem Relation Age of Onset   Healthy Mother    Healthy Father     Social History   Tobacco Use   Smoking status: Never   Smokeless tobacco: Never  Vaping Use   Vaping Use: Never used  Substance Use Topics   Alcohol use: Never   Drug use: Never    Home Medications Prior to Admission medications   Medication Sig Start Date End Date Taking? Authorizing Provider  HYDROcodone-acetaminophen (NORCO/VICODIN) 5-325 MG tablet Take 1-2 tablets by mouth every 6 (six) hours as needed. 09/10/20   Rolan Bucco, MD  ibuprofen (ADVIL) 600 MG tablet Take 600 mg by mouth every 6 (six) hours as needed.    [provider]  metoCLOPramide (REGLAN) 10 MG tablet Take 1 tablet (10 mg total) by mouth every 8 (eight) hours as needed for up to 15 doses for nausea or vomiting. 07/12/20   Terald Sleeper, MD  tamsulosin (FLOMAX) 0.4 MG CAPS capsule Take 1 capsule (0.4 mg total) by mouth daily. 09/10/20   Rolan Bucco, MD    Allergies    Bee venom and Penicillins  Review of Systems   Review  of Systems  Constitutional:  Positive for chills, fatigue and fever.  Respiratory:  Positive for cough. Negative for shortness of breath.   Cardiovascular:  Negative for chest pain.  Gastrointestinal:  Negative for diarrhea, nausea and vomiting.  Musculoskeletal:  Positive for myalgias.  Neurological:  Positive for headaches.  All other systems reviewed and are negative.  Physical Exam Updated Vital Signs BP 122/74   Pulse 87   Temp 99.4 F (37.4 C) (Oral)   Resp 20   SpO2 100%   Physical Exam Vitals and nursing note reviewed.  Constitutional:      Appearance: He is not ill-appearing or diaphoretic.  HENT:     Head: Normocephalic and atraumatic.  Eyes:     Conjunctiva/sclera: Conjunctivae normal.  Cardiovascular:     Rate and Rhythm: Normal rate and regular rhythm.     Pulses: Normal pulses.  Pulmonary:     Effort: Pulmonary effort is normal.     Breath sounds: Normal breath sounds. No wheezing, rhonchi or rales.  Abdominal:     Tenderness: There is no abdominal tenderness. There is no guarding or rebound.  Skin:    General: Skin is warm and dry.     Coloration: Skin is not jaundiced.  Neurological:     Mental Status: He is alert.    ED Results / Procedures / Treatments  Labs (all labs ordered are listed, but only abnormal results are displayed) Labs Reviewed  RESP PANEL BY RT-PCR (FLU A&B, COVID) ARPGX2 - Abnormal; Notable for the following components:      Result Value   SARS Coronavirus 2 by RT PCR POSITIVE (*)    All other components within normal limits    EKG None  Radiology No results found.  Procedures Procedures   Medications Ordered in ED Medications - No data to display  ED Course  I have reviewed the triage vital signs and the nursing notes.  Pertinent labs & imaging results that were available during my care of the patient were reviewed by me and considered in my medical decision making (see chart for details).    MDM  Rules/Calculators/A&P                           30 year old male who presents to the ED today with complaint of COVID-like symptoms that began last night.  Unvaccinated.  On arrival to the ED patient is slightly elevated temp at 99.4.  Nontachycardic, nontachypneic, satting 100% on room air.  He did have a COVID and flu test done while in the waiting room which has returned positive for COVID-19.  On my exam he is resting comfortably, laying in a darkened room.  He appears to be in no acute distress.  He speaking full sentences without difficulty.  His lungs are clear to auscultation bilaterally.  He denies any vomiting or diarrhea.  He denies any shortness of breath.  I do not feel he needs further work-up at this time.  He is an otherwise healthy 30 year old that does not qualify for oral antiviral medication.  Have recommended ibuprofen and Tylenol as needed for pain, rest, increase fluid intake.  He is in agreement with plan.  Stable for discharge   This note was prepared using Dragon voice recognition software and may include unintentional dictation errors due to the inherent limitations of voice recognition software.   Final Clinical Impression(s) / ED Diagnoses Final diagnoses:  COVID-19    Rx / DC Orders ED Discharge Orders     None        Discharge Instructions      You have tested POSITIVE for COVID 19 today. It is recommended that you self isolate for the next 5 days. Cleared: 09/15. Afterwards you should wear a mask for an additional 5 days when you are around other individuals.   I would recommend Ibuprofen and Tylenol as needed for pain and fevers. Drink plenty of fluids to stay hydrated. Rest as much as possible.   Return to the ED for any new/worsening symptoms including severe shortness of breath, chest pain, fingers/lips turning blue, passing out, or any other new/concerning symptoms.        Tanda Rockers, PA-C 03/04/21 2046    Tegeler, Canary Brim,  MD 03/04/21 2113

## 2021-04-26 ENCOUNTER — Other Ambulatory Visit: Payer: Self-pay

## 2021-04-26 ENCOUNTER — Emergency Department (HOSPITAL_BASED_OUTPATIENT_CLINIC_OR_DEPARTMENT_OTHER): Payer: Medicaid - Out of State | Admitting: Radiology

## 2021-04-26 ENCOUNTER — Encounter (HOSPITAL_BASED_OUTPATIENT_CLINIC_OR_DEPARTMENT_OTHER): Payer: Self-pay | Admitting: Obstetrics and Gynecology

## 2021-04-26 ENCOUNTER — Emergency Department (HOSPITAL_BASED_OUTPATIENT_CLINIC_OR_DEPARTMENT_OTHER)
Admission: EM | Admit: 2021-04-26 | Discharge: 2021-04-26 | Disposition: A | Discharge status: Home / Self Care | Payer: Medicaid - Out of State | Attending: Emergency Medicine | Admitting: Emergency Medicine

## 2021-04-26 ENCOUNTER — Emergency Department (HOSPITAL_BASED_OUTPATIENT_CLINIC_OR_DEPARTMENT_OTHER): Payer: Medicaid - Out of State

## 2021-04-26 DIAGNOSIS — R0602 Shortness of breath: Secondary | ICD-10-CM

## 2021-04-26 DIAGNOSIS — Z20822 Contact with and (suspected) exposure to covid-19: Secondary | ICD-10-CM | POA: Diagnosis not present

## 2021-04-26 DIAGNOSIS — J181 Lobar pneumonia, unspecified organism: Secondary | ICD-10-CM | POA: Diagnosis not present

## 2021-04-26 DIAGNOSIS — J189 Pneumonia, unspecified organism: Secondary | ICD-10-CM

## 2021-04-26 LAB — COMPREHENSIVE METABOLIC PANEL
ALT: 24 U/L (ref 0–44)
AST: 23 U/L (ref 15–41)
Albumin: 4.1 g/dL (ref 3.5–5.0)
Alkaline Phosphatase: 48 U/L (ref 38–126)
Anion gap: 7 (ref 5–15)
BUN: 12 mg/dL (ref 6–20)
CO2: 26 mmol/L (ref 22–32)
Calcium: 9.3 mg/dL (ref 8.9–10.3)
Chloride: 103 mmol/L (ref 98–111)
Creatinine, Ser: 1.19 mg/dL (ref 0.61–1.24)
GFR, Estimated: >60 mL/min (ref >60)
Glucose, Bld: 92 mg/dL (ref 70–99)
Potassium: 4 mmol/L (ref 3.5–5.1)
Sodium: 136 mmol/L (ref 135–145)
Total Bilirubin: 0.7 mg/dL (ref 0.3–1.2)
Total Protein: 7.6 g/dL (ref 6.5–8.1)

## 2021-04-26 LAB — CBC WITH DIFFERENTIAL/PLATELET
Abs Immature Granulocytes: 0.03 10*3/uL (ref 0.00–0.07)
Basophils Absolute: 0.1 10*3/uL (ref 0.0–0.1)
Basophils Relative: 1 %
Eosinophils Absolute: 0.4 10*3/uL (ref 0.0–0.5)
Eosinophils Relative: 3 %
HCT: 42.9 % (ref 39.0–52.0)
Hemoglobin: 14.5 g/dL (ref 13.0–17.0)
Immature Granulocytes: 0 %
Lymphocytes Relative: 9 %
Lymphs Abs: 1.1 10*3/uL (ref 0.7–4.0)
MCH: 30.9 pg (ref 26.0–34.0)
MCHC: 33.8 g/dL (ref 30.0–36.0)
MCV: 91.3 fL (ref 80.0–100.0)
Monocytes Absolute: 0.6 10*3/uL (ref 0.1–1.0)
Monocytes Relative: 5 %
Neutro Abs: 10.1 10*3/uL — ABNORMAL HIGH (ref 1.7–7.7)
Neutrophils Relative %: 82 %
Platelets: 253 10*3/uL (ref 150–400)
RBC: 4.7 MIL/uL (ref 4.22–5.81)
RDW: 11.7 % (ref 11.5–15.5)
WBC: 12.3 10*3/uL — ABNORMAL HIGH (ref 4.0–10.5)
nRBC: 0 % (ref 0.0–0.2)

## 2021-04-26 LAB — RESP PANEL BY RT-PCR (FLU A&B, COVID) ARPGX2
Influenza A by PCR: NEGATIVE
Influenza B by PCR: NEGATIVE
SARS Coronavirus 2 by RT PCR: NEGATIVE

## 2021-04-26 MED ORDER — ACETAMINOPHEN 325 MG PO TABS
650.0000 mg | ORAL_TABLET | Freq: Once | ORAL | Status: AC
  Administered 2021-04-26: 650 mg via ORAL
  Filled 2021-04-26: qty 2

## 2021-04-26 MED ORDER — DOXYCYCLINE HYCLATE 100 MG PO CAPS: 100.0000 mg | ORAL_CAPSULE | Freq: Two times a day (BID) | ORAL | 0 refills | Status: AC

## 2021-04-26 MED ORDER — SODIUM CHLORIDE 0.9 % IV BOLUS
1000.0000 mL | Freq: Once | INTRAVENOUS | Status: AC
  Administered 2021-04-26: 1000 mL via INTRAVENOUS

## 2021-04-26 MED ORDER — ALBUTEROL SULFATE HFA 108 (90 BASE) MCG/ACT IN AERS: 2.0000 | INHALATION_SPRAY | RESPIRATORY_TRACT | Status: DC | PRN

## 2021-04-26 MED ORDER — IOHEXOL 350 MG/ML SOLN
75.0000 mL | Freq: Once | INTRAVENOUS | Status: AC | PRN
  Administered 2021-04-26: 75 mL via INTRAVENOUS

## 2021-04-26 NOTE — ED Triage Notes (Signed)
Patient reports to the ER for Shortness of breath. Patient states it started yesterday and has gotten progressively worse. Patient endorses some mild coughing.

## 2021-04-26 NOTE — ED Notes (Signed)
Pt felt winded after walking back from Xray to room 7. Upon return pt was 100%, Hr 101 but decreased to a HR of 93.

## 2021-04-26 NOTE — Discharge Instructions (Addendum)
We discussed results of your CT scan at length, you are provided with a copy of these.  He will go home on a short course of antibiotics to help treat your community-acquired pneumonia.  Please take 1 tablet twice a day for the next 5 days.  If you experience any worsening chest pain, worsening shortness of breath you will need to return to the emergency department.  The [facility] is attached to your chart, once you complete antibiotics you will need a follow-up x-ray.

## 2021-04-26 NOTE — ED Notes (Signed)
Pt states he thinks chest pain is from coughing. S1 S2 clear. Lung sounds clear, Cap refill wdl

## 2021-04-26 NOTE — ED Provider Notes (Signed)
[facility] EMERGENCY DEPT Provider Note   CSN: [id number] Arrival date & time: 04/26/21  1122     History Chief Complaint  Patient presents with   Shortness of Breath    Walter Searl. is a 30 y.o. male.  30 y.o male with no PMH presents to the ED with a chief complaint of shortness of breath x yesterday.  Patient reports his symptoms began yesterday, when he was walking down and up the stairs and noticed himself to be very winded.  He also endorses a productive cough with some yellow sputum, states his girlfriend gave him some " pill for the cough ", which he took yesterday and today without any improvement in symptoms.  He does endorse some pain along the right side of his chest, this has been ongoing for the last couple days.  Of note, patient did have a COVID-19 infection in the month of September.  He does not have any prior history of COVID-19 or influenza immunizations.  He denies any tobacco use, prior history of CAD, family history of CAD, prior history of blood clots.    The history is provided by the patient and medical records.  Shortness of Breath Severity:  Moderate Associated symptoms: chest pain and fever   Associated symptoms: no abdominal pain, no sore throat and no vomiting       History reviewed. No pertinent past medical history.  There are no problems to display for this patient.   History reviewed. No pertinent surgical history.     Family History  Problem Relation Age of Onset   Healthy Mother    Healthy Father     Social History   Tobacco Use   Smoking status: Never   Smokeless tobacco: Never  Vaping Use   Vaping Use: Never used  Substance Use Topics   Alcohol use: Never   Drug use: Not Currently    Home Medications Prior to Admission medications   Medication Sig Start Date End Date Taking? Authorizing Provider  doxycycline (VIBRAMYCIN) 100 MG capsule Take 1 capsule (100 mg total) by mouth 2 (two) times daily for 5  days. 04/26/21 05/01/21 Yes Efrain Clauson, Beverley Fiedler, PA-C  HYDROcodone-acetaminophen (NORCO/VICODIN) 5-325 MG tablet Take 1-2 tablets by mouth every 6 (six) hours as needed. 09/10/20   Malvin Johns, MD  ibuprofen (ADVIL) 600 MG tablet Take 600 mg by mouth every 6 (six) hours as needed.    [provider]  metoCLOPramide (REGLAN) 10 MG tablet Take 1 tablet (10 mg total) by mouth every 8 (eight) hours as needed for up to 15 doses for nausea or vomiting. 07/12/20   Wyvonnia Dusky, MD  tamsulosin (FLOMAX) 0.4 MG CAPS capsule Take 1 capsule (0.4 mg total) by mouth daily. 09/10/20   Malvin Johns, MD    Allergies    Bee venom and Penicillins  Review of Systems   Review of Systems  Constitutional:  Positive for chills and fever.  HENT:  Positive for rhinorrhea. Negative for sore throat.   Respiratory:  Positive for shortness of breath.   Cardiovascular:  Positive for chest pain. Negative for leg swelling.  Gastrointestinal:  Negative for abdominal pain, nausea and vomiting.  Genitourinary:  Negative for flank pain.  Musculoskeletal:  Negative for back pain.  Neurological:  Negative for light-headedness and numbness.  All other systems reviewed and are negative.  Physical Exam Updated Vital Signs BP 137/89 (BP Location: Left Arm)   Pulse 76   Temp 100.3 F (37.9 C)  Resp (!) 21   SpO2 100%   Physical Exam Vitals and nursing note reviewed.  Constitutional:      Appearance: He is ill-appearing.  HENT:     Head: Normocephalic and atraumatic.  Cardiovascular:     Rate and Rhythm: Normal rate.     Comments: Range between 90's to 100s HR Pulmonary:     Effort: Pulmonary effort is normal. No tachypnea.     Breath sounds: Examination of the right-lower field reveals decreased breath sounds. Examination of the left-lower field reveals decreased breath sounds. Decreased breath sounds present.  Chest:     Chest wall: No tenderness.  Musculoskeletal:     Cervical back: Normal range of  motion and neck supple.     Right lower leg: No tenderness. No edema.     Left lower leg: No tenderness. No edema.  Skin:    General: Skin is warm and dry.  Neurological:     Mental Status: He is alert and oriented to person, place, and time.    ED Results / Procedures / Treatments   Labs (all labs ordered are listed, but only abnormal results are displayed) Labs Reviewed  CBC WITH DIFFERENTIAL/PLATELET - Abnormal; Notable for the following components:      Result Value   WBC 12.3 (*)    Neutro Abs 10.1 (*)    All other components within normal limits  RESP PANEL BY RT-PCR (FLU A&B, COVID) ARPGX2  COMPREHENSIVE METABOLIC PANEL    EKG EKG Interpretation  Date/Time:  Wednesday April 26 2021 11:32:31 EDT Ventricular Rate:  85 PR Interval:  152 QRS Duration: 90 QT Interval:  342 QTC Calculation: 406 R Axis:   67 Text Interpretation: Normal sinus rhythm Possible Left atrial enlargement Left ventricular hypertrophy Abnormal ECG No old tracing to compare Confirmed by Calvert Cantor [phone]) on 04/26/2021 11:37:46 AM  Radiology DG Chest 2 View  Result Date: 04/26/2021 CLINICAL DATA:  Shortness of breath. EXAM: CHEST - 2 VIEW COMPARISON:  None. FINDINGS: Normal heart, mediastinum and hila. Clear lungs.  No pleural effusion or pneumothorax. Skeletal structures are unremarkable. IMPRESSION: Normal chest radiographs. Electronically Signed   By: Lajean Manes M.D.   On: 04/26/2021 11:43   CT Angio Chest PE W and/or Wo Contrast  Result Date: 04/26/2021 CLINICAL DATA:  Chest pain with shortness of breath pleurisy. History of COVID September 2022 EXAM: CT ANGIOGRAPHY CHEST WITH CONTRAST TECHNIQUE: Multidetector CT imaging of the chest was performed using the standard protocol during bolus administration of intravenous contrast. Multiplanar CT image reconstructions and MIPs were obtained to evaluate the vascular anatomy. CONTRAST:  81mL OMNIPAQUE IOHEXOL 350 MG/ML SOLN COMPARISON:   04/26/2021 chest x-ray FINDINGS: Cardiovascular: Intact thoracic aorta. Patent 3 vessel arch anatomy. No aneurysm or dissection. No mediastinal hemorrhage or hematoma. Central pulmonary arteries are patent and normal in caliber. No significant central or proximal hilar pulmonary embolus. Limited assessment of the small basilar peripheral segmental branches. Normal heart size.  No pericardial effusion. Central venous structures appear patent.  No veno-occlusive process. Mediastinum/Nodes: Thyroid unremarkable. Trachea and central airways are patent. Esophagus nondilated. Mildly prominent prevascular and hilar lymph nodes but no bulky adenopathy. Lungs/Pleura: Minor focal posterior ground-glass opacity in the right upper lobe along the fissure, images 45 through 51/series 6. This measures up to 13 mm. This nonspecific ground-glass focal opacity is nonspecific for ground-glass nodule or infectious/inflammatory process. Otherwise lungs are clear. Minor atelectasis or scarring in the inferior right middle lobe. No other pleural abnormality, effusion,  or pneumothorax. Upper Abdomen: No acute abnormality. Musculoskeletal: No chest wall abnormality. No acute or significant osseous findings. Review of the MIP images confirms the above findings. IMPRESSION: Negative for significant acute pulmonary embolus by CTA. No other acute intrathoracic vascular or significant non vascular finding. Right upper lobe posterior subpleural ground-glass nodule measuring up to 13 mm. Initial follow-up with CT at 6-12 months is recommended to confirm persistence. If persistent, repeat CT is recommended every 2 years until 5 years of stability has been established. This recommendation follows the consensus statement: Guidelines for Management of Incidental Pulmonary Nodules Detected on CT Images: From the Fleischner Society 2017; Radiology 2017; 284:228-243. Electronically Signed   By: Jerilynn Mages.  Shick M.D.   On: 04/26/2021 15:11     Procedures Procedures   Medications Ordered in ED Medications  albuterol (VENTOLIN HFA) 108 (90 Base) MCG/ACT inhaler 2 puff (has no administration in time range)  acetaminophen (TYLENOL) tablet 650 mg (650 mg Oral Given 04/26/21 1135)  sodium chloride 0.9 % bolus 1,000 mL (1,000 mLs Intravenous New Bag/Given 04/26/21 1249)  iohexol (OMNIPAQUE) 350 MG/ML injection 75 mL (75 mLs Intravenous Contrast Given 04/26/21 1449)    ED Course  I have reviewed the triage vital signs and the nursing notes.  Pertinent labs & imaging results that were available during my care of the patient were reviewed by me and considered in my medical decision making (see chart for details).    MDM Rules/Calculators/A&P   Patient presents to the ED with a chief complaint of shortness of breath that began yesterday, he first noted this when he was going up and down the stairs and felt like he was very out of breath.  In addition he does report right-sided chest pain that has been ongoing since yesterday.  This denies any prior history of immunization for influenza or COVID-19.  Of note, patient did have COVID-19 in the month of September.  He arrived in the ED with stable vital signs, oxygen saturation was around 95%, heart rate noted to be in the mid 90s to 100s, he was febrile with a temperature of 100.3, Tylenol was given on arrival and after recheck temperature improved to 99.5.  During evaluation his lungs are decreased to auscultation throughout, there is no tenderness to palpation of his abdomen.  Oropharynx is clear without any erythema or tonsillar exudate noted.  There is some nasal congestion noted on my exam.  In addition there is no swelling to bilateral legs, no pitting edema.  X-ray I obtained in triage without any signs of pneumonia, pleural effusion, pneumothorax.  EKG sinus rhythm with a rate in the 80s, without any changes significant with ST elevations or infarct.  He does not have any history of  heart disease, no hypertension, no hypercholesterolemia, nontobacco user, does not have risk factors for CAD without any family history.  Respiratory panel has been sent.  This is negative for influenza, COVID-19.  Labs were obtained as patient had increased work of breathing, slight elevation in his heart rate and there was concern for pulmonary embolism.  CBC with a slight leukocytosis of 12.3.  CMP without any electrolyte derangement, the rest of his labs are within normal limits.  CT Angio chest: Negative for significant acute pulmonary embolus by CTA.     No other acute intrathoracic vascular or significant non vascular  finding.     Right upper lobe posterior subpleural ground-glass nodule measuring  up to 13 mm.     Initial follow-up with  CT at 6-12 months is recommended to confirm  persistence. If persistent, repeat CT is recommended every 2 years  until 5 years of stability has been established. This recommendation  follows the consensus statement: Guidelines for Management of  Incidental Pulmonary Nodules Detected on CT Images: From the  Fleischner Society 2017; Radiology 2017; 284:228-243.      Results were discussed at length with patient.  He was provided with a copy of the CT for her records.  I do feel that this is multiple infections due to patient's low-grade temp, we did discuss treatment with antibiotics at this time for the next 5 days.  He is agreeable to plan and management, return precautions discussed at length.  Patient stable for discharge.  Portions of this note were generated with Scientist, clinical (histocompatibility and immunogenetics). Dictation errors may occur despite best attempts at proofreading.  Final Clinical Impression(s) / ED Diagnoses Final diagnoses:  Shortness of breath  Community acquired pneumonia of right upper lobe of lung    Rx / DC Orders ED Discharge Orders          Ordered    doxycycline (VIBRAMYCIN) 100 MG capsule  2 times daily        04/26/21 1521              Claude Manges, PA-C 04/26/21 1523    Pollyann Savoy, MD 04/27/21 1456

## 2021-04-26 NOTE — ED Notes (Signed)
Patient transported to CT 

## 2021-07-12 ENCOUNTER — Ambulatory Visit (HOSPITAL_BASED_OUTPATIENT_CLINIC_OR_DEPARTMENT_OTHER): Payer: BC Managed Care – PPO | Admitting: Family Medicine

## 2021-07-12 ENCOUNTER — Other Ambulatory Visit: Payer: Self-pay

## 2021-07-12 ENCOUNTER — Encounter (HOSPITAL_BASED_OUTPATIENT_CLINIC_OR_DEPARTMENT_OTHER): Payer: Self-pay | Admitting: Family Medicine

## 2021-07-12 VITALS — BP 132/94 | HR 67 | Ht 74.0 in | Wt 223.0 lb

## 2021-07-12 DIAGNOSIS — G8929 Other chronic pain: Secondary | ICD-10-CM

## 2021-07-12 DIAGNOSIS — M25561 Pain in right knee: Secondary | ICD-10-CM | POA: Diagnosis not present

## 2021-07-12 DIAGNOSIS — R030 Elevated blood-pressure reading, without diagnosis of hypertension: Secondary | ICD-10-CM | POA: Insufficient documentation

## 2021-07-12 DIAGNOSIS — M25562 Pain in left knee: Secondary | ICD-10-CM | POA: Diagnosis not present

## 2021-07-12 DIAGNOSIS — Z0000 Encounter for general adult medical examination without abnormal findings: Secondary | ICD-10-CM

## 2021-07-12 DIAGNOSIS — Z87442 Personal history of urinary calculi: Secondary | ICD-10-CM | POA: Diagnosis not present

## 2021-07-12 NOTE — Assessment & Plan Note (Signed)
History of kidney stone in the past, requesting referral to nephrology, referral placed today Discussed general measures, recommend maintaining adequate hydration

## 2021-07-12 NOTE — Patient Instructions (Signed)
°  Medication Instructions:  Your physician recommends that you continue on your current medications as directed. Please refer to the Current Medication list given to you today. --If you need a refill on any your medications before your next appointment, please call your pharmacy first. If no refills are authorized on file call the office.-- Lab Work: Your physician has recommended that you have lab work today: CBC, CMP, Lipid, and TSH If you have labs (blood work) drawn today and your tests are completely normal, you will receive your results via [place] a phone call from our staff.  Please ensure you check your voicemail in the event that you authorized detailed messages to be left on a delegated number. If you have any lab test that is abnormal or we need to change your treatment, we will call you to review the results.  Referrals/Procedures/Imaging: A referral has been placed for you to [facility] for evaluation and treatment. Someone from the scheduling department will be in contact with you in regards to coordinating your consultation. If you do not hear from any of the schedulers within 7-10 business days please give their office a call.  Follow-Up: Your next appointment:   Your physician recommends that you schedule a follow-up appointment in: 2-3 MONTHS for CPE with Dr. de [place]  You will receive a text message or e-mail with a link to a survey about your care and experience with [place] today! We would greatly appreciate your feedback!   Thanks for letting us be apart of your health journey!!  Primary Care and Sports Medicine   Dr. Arlina Robes [place]   We encourage you to activate your patient portal called "MyChart".  Sign up information is provided on this After Visit Summary.  MyChart is used to connect with patients for Virtual Visits (Telemedicine).  Patients are able to view lab/test results, encounter notes, upcoming appointments, etc.  Non-urgent messages can be sent to  your provider as well. To learn more about what you can do with MyChart, please visit --  [email or web address].

## 2021-07-12 NOTE — Assessment & Plan Note (Signed)
Exam largely benign today, did have some crepitus, no pain elicited Discussed general measures to help with pain control, can continue with OTC medications Could consider referral to physical therapy for further evaluation and recommendations regarding home exercise program Could also consider proceeding with x-ray imaging, patient wishes to hold off for now

## 2021-07-12 NOTE — Progress Notes (Signed)
New Patient Office Visit  Subjective:  Patient ID: Walter Mendoza., male    DOB: 01-14-1991  Age: 31 y.o. MRN: [id number]  CC:  Chief Complaint  Patient presents with   Establish Care    No prior PCP in the last 3 years   Nephrolithiasis    Patient states he has a hx of kidney stones and was referred to see nephrology last year but didn't have insurance so he never followed up on it. He request to have his creatinine reassessed today as a result   Knee Pain    Patient states he has had bilateral joint pain in his knees for the last 5 years or so. He denies any injury or origin of the pain. He admits to being very active   Hypertension    Patient states he checks his BP at home and is averaging a systolic in the 150/160 range and diastolic in the upper 90's.     HPI Walter Mendoza. is a 31 year old male presenting to establish in clinic.  He has current concerns as outlined above.  Reports history of kidney stone in the past.  Chronic bilateral knee pain: Patient has had ongoing chronic knee pain for at least 5 years.  Primarily notes pain at the conclusion of his workday.  No specific activities seem to worsen the symptoms other than typical day-to-day activities.  Will typically use OTC medications to help with pain control.  Denies any prior injuries or issues with the knees in the past.  Elevated blood pressure: Reports that occasionally he has had elevated blood pressure on readings at home.  Occasionally these readings will be in the 150s systolic.  Denies any issues with chest pain or headaches.  Does have some family history of high blood pressure.  Nephrolithiasis: Had episode in the past of flank pain and was found to have kidney stone at the time.  Patient passed that stone and has not had any issues since then.  He was referred to nephrologist, however was not able to proceed with this as he did not have insurance at that time.  He is requesting referral to nephrologist now for  further evaluation and discussion.  Patient is originally from [place], [place].  He has been living in the area here for about 3 years.  He currently works at a [employer].  Outside of work, he enjoys playing basketball.  Past Medical History:  Diagnosis Date   Kidney stones     History reviewed. No pertinent surgical history.  Family History  Problem Relation Age of Onset   Hypertension Mother    Blindness Father     Social History   Socioeconomic History   Marital status: Single    Spouse name: Not on file   Number of children: Not on file   Years of education: Not on file   Highest education level: Not on file  Occupational History   Not on file  Tobacco Use   Smoking status: Never   Smokeless tobacco: Never  Vaping Use   Vaping Use: Never used  Substance and Sexual Activity   Alcohol use: Yes    Alcohol/week: 1.0 - 2.0 standard drink    Types: 1 - 2 Cans of beer per week   Drug use: Never   Sexual activity: Yes  Other Topics Concern   Not on file  Social History Narrative   Not on file   Social Determinants of Health   Financial Resource  Strain: Not on file  Food Insecurity: Not on file  Transportation Needs: Not on file  Physical Activity: Not on file  Stress: Not on file  Social Connections: Not on file  Intimate Partner Violence: Not on file    Objective:   Today's Vitals: BP (!) 132/94    Pulse 67    Ht 6\' 2"  (1.88 m)    Wt 223 lb (101.2 kg)    SpO2 97%    BMI 28.63 kg/m   Physical Exam  31 year old male in no acute distress Cardiovascular exam with regular rate and rhythm, no murmur appreciated Lungs clear to auscultation bilaterally Bilateral knee: No obvious deformity. No effusion.  Negative patellar grind.  Positive crepitus. Full ROM for flexion and extension.  Strength 5 out of 5 for flexion and extension. Anterior drawer: Negative Posterior drawer: Negative Lachman: Negative Varus stress test: Negative Valgus stress  test: Negative McMurray's: Negative Neurovascularly intact.  No evidence of lymphatic disease.  Assessment & Plan:   Problem List Items Addressed This Visit       Other   Elevated blood pressure reading in office without diagnosis of hypertension - Primary    Blood pressure borderline in office today Recommend intermittent monitoring at home Recommend lifestyle modifications including DASH diet, regular aerobic exercise Would also recommend working towards healthy, gradual weight loss as part of lifestyle modifications Will continue to monitor blood pressure at follow-up visits      Relevant Orders   CBC with Differential/Platelet   Comprehensive metabolic panel   Lipid panel   TSH Rfx on Abnormal to Free T4   History of nephrolithiasis    History of kidney stone in the past, requesting referral to nephrology, referral placed today Discussed general measures, recommend maintaining adequate hydration      Relevant Orders   Ambulatory referral to Nephrology   Bilateral knee pain    Exam largely benign today, did have some crepitus, no pain elicited Discussed general measures to help with pain control, can continue with OTC medications Could consider referral to physical therapy for further evaluation and recommendations regarding home exercise program Could also consider proceeding with x-ray imaging, patient wishes to hold off for now      Other Visit Diagnoses     Wellness examination       Relevant Orders   CBC with Differential/Platelet   Comprehensive metabolic panel   Lipid panel   TSH Rfx on Abnormal to Free T4       Outpatient Encounter Medications as of 07/12/2021  Medication Sig   [DISCONTINUED] HYDROcodone-acetaminophen (NORCO/VICODIN) 5-325 MG tablet Take 1-2 tablets by mouth every 6 (six) hours as needed.   [DISCONTINUED] ibuprofen (ADVIL) 600 MG tablet Take 600 mg by mouth every 6 (six) hours as needed.   [DISCONTINUED] metoCLOPramide (REGLAN) 10 MG  tablet Take 1 tablet (10 mg total) by mouth every 8 (eight) hours as needed for up to 15 doses for nausea or vomiting.   [DISCONTINUED] tamsulosin (FLOMAX) 0.4 MG CAPS capsule Take 1 capsule (0.4 mg total) by mouth daily.   No facility-administered encounter medications on file as of 07/12/2021.    Follow-up: Return in about 3 months (around 10/10/2021).  We will plan to check labs in advance of upcoming wellness exam  Sarie Stall J De 10/12/2021, MD

## 2021-07-12 NOTE — Assessment & Plan Note (Signed)
Blood pressure borderline in office today Recommend intermittent monitoring at home Recommend lifestyle modifications including DASH diet, regular aerobic exercise Would also recommend working towards healthy, gradual weight loss as part of lifestyle modifications Will continue to monitor blood pressure at follow-up visits

## 2021-07-13 LAB — LIPID PANEL
Chol/HDL Ratio: 3.1 ratio (ref 0.0–5.0)
Cholesterol, Total: 272 mg/dL — ABNORMAL HIGH (ref 100–199)
HDL: 89 mg/dL (ref >39)
LDL Chol Calc (NIH): 169 mg/dL — ABNORMAL HIGH (ref 0–99)
Triglycerides: 85 mg/dL (ref 0–149)
VLDL Cholesterol Cal: 14 mg/dL (ref 5–40)

## 2021-07-13 LAB — CBC WITH DIFFERENTIAL/PLATELET
Basophils Absolute: 0.1 10*3/uL (ref 0.0–0.2)
Basos: 1 %
EOS (ABSOLUTE): 0.7 10*3/uL — ABNORMAL HIGH (ref 0.0–0.4)
Eos: 8 %
Hematocrit: 43.8 % (ref 37.5–51.0)
Hemoglobin: 14.5 g/dL (ref 13.0–17.7)
Immature Grans (Abs): 0 10*3/uL (ref 0.0–0.1)
Immature Granulocytes: 0 %
Lymphocytes Absolute: 2.2 10*3/uL (ref 0.7–3.1)
Lymphs: 26 %
MCH: 29.7 pg (ref 26.6–33.0)
MCHC: 33.1 g/dL (ref 31.5–35.7)
MCV: 90 fL (ref 79–97)
Monocytes Absolute: 0.5 10*3/uL (ref 0.1–0.9)
Monocytes: 6 %
Neutrophils Absolute: 5 10*3/uL (ref 1.4–7.0)
Neutrophils: 59 %
Platelets: 260 10*3/uL (ref 150–450)
RBC: 4.89 x10E6/uL (ref 4.14–5.80)
RDW: 12 % (ref 11.6–15.4)
WBC: 8.6 10*3/uL (ref 3.4–10.8)

## 2021-07-13 LAB — COMPREHENSIVE METABOLIC PANEL
ALT: 27 IU/L (ref 0–44)
AST: 31 IU/L (ref 0–40)
Albumin/Globulin Ratio: 1.4 (ref 1.2–2.2)
Albumin: 4.4 g/dL (ref 4.0–5.0)
Alkaline Phosphatase: 62 IU/L (ref 44–121)
BUN/Creatinine Ratio: 12 (ref 9–20)
BUN: 14 mg/dL (ref 6–20)
Bilirubin Total: 0.4 mg/dL (ref 0.0–1.2)
CO2: 23 mmol/L (ref 20–29)
Calcium: 9.8 mg/dL (ref 8.7–10.2)
Chloride: 103 mmol/L (ref 96–106)
Creatinine, Ser: 1.2 mg/dL (ref 0.76–1.27)
Globulin, Total: 3.1 g/dL (ref 1.5–4.5)
Glucose: 96 mg/dL (ref 70–99)
Potassium: 4.4 mmol/L (ref 3.5–5.2)
Sodium: 140 mmol/L (ref 134–144)
Total Protein: 7.5 g/dL (ref 6.0–8.5)
eGFR: 82 mL/min/{1.73_m2} (ref >59)

## 2021-07-13 LAB — TSH RFX ON ABNORMAL TO FREE T4: TSH: 2.91 u[IU]/mL (ref 0.450–4.500)

## 2021-08-22 ENCOUNTER — Encounter (HOSPITAL_BASED_OUTPATIENT_CLINIC_OR_DEPARTMENT_OTHER): Payer: Self-pay | Admitting: Family Medicine

## 2021-08-22 ENCOUNTER — Other Ambulatory Visit: Payer: Self-pay

## 2021-08-22 ENCOUNTER — Ambulatory Visit (HOSPITAL_BASED_OUTPATIENT_CLINIC_OR_DEPARTMENT_OTHER): Payer: BC Managed Care – PPO | Admitting: Family Medicine

## 2021-08-22 VITALS — BP 120/78 | HR 66 | Ht 74.0 in | Wt 220.0 lb

## 2021-08-22 DIAGNOSIS — R109 Unspecified abdominal pain: Secondary | ICD-10-CM | POA: Diagnosis not present

## 2021-08-22 DIAGNOSIS — R1031 Right lower quadrant pain: Secondary | ICD-10-CM

## 2021-08-22 DIAGNOSIS — Z87442 Personal history of urinary calculi: Secondary | ICD-10-CM

## 2021-08-22 LAB — POCT URINALYSIS DIPSTICK
Bilirubin, UA: NEGATIVE
Blood, UA: NEGATIVE
Glucose, UA: NEGATIVE
Ketones, UA: NEGATIVE
Leukocytes, UA: NEGATIVE
Nitrite, UA: NEGATIVE
Protein, UA: NEGATIVE
Spec Grav, UA: >=1.03 — AB (ref 1.010–1.025)
Urobilinogen, UA: 0.2 E.U./dL
pH, UA: 5.5 (ref 5.0–8.0)

## 2021-08-22 NOTE — Assessment & Plan Note (Addendum)
Patient reports that last night he began to experience right-sided flank pain. Pain was acute in onset, was at work, lifting something and felt hot and flushed at the time.  At 1 point, he felt that he was going to pass out.  He does have a history of kidney stones, had prior imaging in March 2022 related to left sided kidney stone.  CT scan also showed a 6 mm stone in the right kidney.  He has not had any associated nausea, vomiting, diarrhea.  No blood in the urine or dysuria.  No fever, however has "felt hot". On exam, mild tenderness to palpation to right-sided paraspinal muscles in lower thoracic, upper lumbar region.  Mild CVA tenderness on right side.  Normal bowel sounds, abdomen nontender, nondistended. Concern for possible nephrolithiasis contributing to current symptoms.  Also possible is acute muscle strain given history and exam Will need to rule out possible kidney stone due to potential associated complications. Will check BMP and UA Will order CT scan, renal stone study to further evaluate for kidney stone, possible obstruction, hydronephrosis We will also refer patient to urology due to history of kidney stones.  He was advised on evaluation following his visit to the emergency department in March 2022, however never arranged for follow-up Plan for follow-up with me as needed Pending results of labs and imaging, may need to discuss additional pharmacotherapy including for pain control as well as to aid in stone passage such as Flomax.  Further interventions/recommendations pending results of labs and imaging

## 2021-08-22 NOTE — Progress Notes (Signed)
° ° °  Procedures performed today:    None.  Independent interpretation of notes and tests performed by another provider:   None.  Brief History, Exam, Impression, and Recommendations:    BP 120/78    Pulse 66    Ht 6\' 2"  (1.88 m)    Wt 220 lb (99.8 kg)    SpO2 99%    BMI 28.25 kg/m   Flank pain Patient reports that last night he began to experience right-sided flank pain. Pain was acute in onset, was at work, lifting something and felt hot and flushed at the time.  At 1 point, he felt that he was going to pass out.  He does have a history of kidney stones, had prior imaging in March 2022 related to left sided kidney stone.  CT scan also showed a 6 mm stone in the right kidney.  He has not had any associated nausea, vomiting, diarrhea.  No blood in the urine or dysuria.  No fever, however has "felt hot". On exam, mild tenderness to palpation to right-sided paraspinal muscles in lower thoracic, upper lumbar region.  Mild CVA tenderness on right side.  Normal bowel sounds, abdomen nontender, nondistended. Concern for possible nephrolithiasis contributing to current symptoms.  Also possible is acute muscle strain given history and exam Will need to rule out possible kidney stone due to potential associated complications. Will check BMP and UA Will order CT scan, renal stone study to further evaluate for kidney stone, possible obstruction, hydronephrosis We will also refer patient to urology due to history of kidney stones.  He was advised on evaluation following his visit to the emergency department in March 2022, however never arranged for follow-up Plan for follow-up with me as needed Pending results of labs and imaging, may need to discuss additional pharmacotherapy including for pain control as well as to aid in stone passage such as Flomax.  Further interventions/recommendations pending results of labs and imaging  History of nephrolithiasis Will refer to urology given history of  nephrolithiasis as well as current symptoms   ___________________________________________ Annalisa Colonna de 10-30-1976, MD, ABFM, CAQSM Primary Care and Sports Medicine [facility]

## 2021-08-22 NOTE — Patient Instructions (Addendum)
°  Medication Instructions:  Your physician recommends that you continue on your current medications as directed. Please refer to the Current Medication list given to you today. --If you need a refill on any your medications before your next appointment, please call your pharmacy first. If no refills are authorized on file call the office.-- Lab Work: Your physician has recommended that you have lab work today: UA and BMET If you have labs (blood work) drawn today and your tests are completely normal, you will receive your results via [place] a phone call from our staff.  Please ensure you check your voicemail in the event that you authorized detailed messages to be left on a delegated number. If you have any lab test that is abnormal or we need to change your treatment, we will call you to review the results.  Referrals/Procedures/Imaging: Your physician recommends that your CT-scan of the kidney (renal stone study). You may have these images done at [facility]. They will contact you about scheduling once the study has been pre-authorized by your insurance. The contact information for [facility] is listed below for your convenience.   [facility] [place]  [phone]  Follow-Up: Your next appointment:   Your physician recommends that you schedule a follow-up appointment in: AS NEEDED IF SYMPTOMS WORSEN with Dr. de [place]  You will receive a text message or e-mail with a link to a survey about your care and experience with [place] today! We would greatly appreciate your feedback!   Thanks for letting us be apart of your health journey!!  Primary Care and Sports Medicine   Dr. Arlina Robes [place]   We encourage you to activate your patient portal called "MyChart".  Sign up information is provided on this After Visit Summary.  MyChart is used to connect with patients for Virtual Visits (Telemedicine).  Patients are able to view  lab/test results, encounter notes, upcoming appointments, etc.  Non-urgent messages can be sent to your provider as well. To learn more about what you can do with MyChart, please visit --  [email or web address].

## 2021-08-22 NOTE — Assessment & Plan Note (Signed)
Will refer to urology given history of nephrolithiasis as well as current symptoms

## 2021-08-23 LAB — BASIC METABOLIC PANEL
BUN/Creatinine Ratio: 14 (ref 9–20)
BUN: 18 mg/dL (ref 6–20)
CO2: 25 mmol/L (ref 20–29)
Calcium: 9.6 mg/dL (ref 8.7–10.2)
Chloride: 101 mmol/L (ref 96–106)
Creatinine, Ser: 1.25 mg/dL (ref 0.76–1.27)
Glucose: 87 mg/dL (ref 70–99)
Potassium: 4.4 mmol/L (ref 3.5–5.2)
Sodium: 138 mmol/L (ref 134–144)
eGFR: 78 mL/min/{1.73_m2} (ref >59)

## 2021-08-29 ENCOUNTER — Encounter: Payer: Self-pay | Admitting: *Deleted

## 2021-09-13 ENCOUNTER — Encounter (HOSPITAL_BASED_OUTPATIENT_CLINIC_OR_DEPARTMENT_OTHER): Payer: Self-pay | Admitting: Family Medicine

## 2021-09-21 ENCOUNTER — Other Ambulatory Visit: Payer: BC Managed Care – PPO

## 2021-09-21 ENCOUNTER — Ambulatory Visit
Admission: RE | Admit: 2021-09-21 | Discharge: 2021-09-21 | Disposition: A | Discharge status: Home / Self Care | Payer: BC Managed Care – PPO | Source: Ambulatory Visit | Attending: Family Medicine | Admitting: Family Medicine

## 2021-09-21 DIAGNOSIS — R1031 Right lower quadrant pain: Secondary | ICD-10-CM

## 2021-09-28 ENCOUNTER — Encounter: Payer: Self-pay | Admitting: Urology

## 2021-09-28 ENCOUNTER — Ambulatory Visit (INDEPENDENT_AMBULATORY_CARE_PROVIDER_SITE_OTHER): Payer: BC Managed Care – PPO | Admitting: Urology

## 2021-09-28 VITALS — BP 136/84 | HR 60 | Ht 74.0 in | Wt 218.0 lb

## 2021-09-28 DIAGNOSIS — N200 Calculus of kidney: Secondary | ICD-10-CM | POA: Diagnosis not present

## 2021-09-28 LAB — URINALYSIS, ROUTINE W REFLEX MICROSCOPIC
Bilirubin, UA: NEGATIVE
Glucose, UA: NEGATIVE
Ketones, UA: NEGATIVE
Leukocytes,UA: NEGATIVE
Nitrite, UA: NEGATIVE
Protein,UA: NEGATIVE
RBC, UA: NEGATIVE
Specific Gravity, UA: >1.03 — ABNORMAL HIGH (ref 1.005–1.030)
Urobilinogen, Ur: 0.2 mg/dL (ref 0.2–1.0)
pH, UA: 6 (ref 5.0–7.5)

## 2021-09-28 NOTE — Progress Notes (Signed)
? ?Assessment: ?1. Nephrolithiasis   ? ? ?Plan: ?I personally reviewed the CT study from 09/21/2021 showing a right lower pole calculus without evidence of obstruction.  I also reviewed his prior CT from 3/22 with results as noted below. ?I discussed options for management of the right renal calculus including observation with shockwave lithotripsy, or ureteroscopy.  At this point, he would like to monitor the stone. ?Stone prevention discussed with the patient and information provided. ?Return to office as needed. ? ?Chief Complaint:  ?Chief Complaint  ?Patient presents with  ? Nephrolithiasis  ? ? ?History of Present Illness: ? ?Walter Mendoza. is a 31 y.o. year old male who is seen in consultation from de [place], Buren Kos, MD for evaluation of nephrolithiasis.  He has had prior episodes of left sided flank pain.  He believes he has passed 2 stones from the left kidney within the past year.  CT imaging from 3/22 showed a 5.8 mm calculus in the lower right kidney and a punctate obstructing stone in the distal left ureter.  He had a recent history of right-sided flank pain.   ?CT abdomen and pelvis without contrast from 09/21/2021 shows a 5 mm right inferior renal collecting system calculus without evidence of obstruction. ?He is not having any flank pain or lower urinary tract symptoms today.  No dysuria or gross hematuria. ? ? ?Past Medical History:  ?Past Medical History:  ?Diagnosis Date  ? Kidney stones   ? ? ?Past Surgical History:  ?No past surgical history on file. ? ?Allergies:  ?Allergies  ?Allergen Reactions  ? Bee Venom Anaphylaxis  ? Penicillins Hives  ? ? ?Family History:  ?Family History  ?Problem Relation Age of Onset  ? Hypertension Mother   ? Blindness Father   ? ? ?Social History:  ?Social History  ? ?Tobacco Use  ? Smoking status: Never  ? Smokeless tobacco: Never  ?Vaping Use  ? Vaping Use: Never used  ?Substance Use Topics  ? Alcohol use: Yes  ?  Alcohol/week: 1.0 - 2.0 standard drink  ?  Types:  1 - 2 Cans of beer per week  ? Drug use: Never  ? ? ?Review of symptoms:  ?Constitutional:  Negative for unexplained weight loss, night sweats, fever, chills ?ENT:  Negative for nose bleeds, sinus pain, painful swallowing ?CV:  Negative for chest pain, shortness of breath, exercise intolerance, palpitations, loss of consciousness ?Resp:  Negative for cough, wheezing, shortness of breath ?GI:  Negative for nausea, vomiting, diarrhea, bloody stools ?GU:  Positives noted in HPI; otherwise negative for gross hematuria, dysuria, urinary incontinence ?Neuro:  Negative for seizures, poor balance, limb weakness, slurred speech ?Psych:  Negative for lack of energy, depression, anxiety ?Endocrine:  Negative for polydipsia, polyuria, symptoms of hypoglycemia (dizziness, hunger, sweating) ?Hematologic:  Negative for anemia, purpura, petechia, prolonged or excessive bleeding, use of anticoagulants  ?Allergic:  Negative for difficulty breathing or choking as a result of exposure to anything; no shellfish allergy; no allergic response (rash/itch) to materials, foods ? ?Physical exam: ?BP 136/84   Pulse 60   Ht 6\' 2"  (1.88 m)   Wt 218 lb (98.9 kg)   BMI 27.99 kg/m?  ?GENERAL APPEARANCE:  Well appearing, well developed, well nourished, NAD ?HEENT: Atraumatic, Normocephalic, oropharynx clear. ?NECK: Supple without lymphadenopathy or thyromegaly. ?LUNGS: Clear to auscultation bilaterally. ?HEART: Regular Rate and Rhythm without murmurs, gallops, or rubs. ?ABDOMEN: Soft, non-tender, No Masses. ?EXTREMITIES: Moves all extremities well.  Without clubbing, cyanosis, or edema. ?NEUROLOGIC:  Alert and oriented x 3, normal gait, CN II-XII grossly intact.  ?MENTAL STATUS:  Appropriate. ?BACK:  Non-tender to palpation.  No CVAT ?SKIN:  Warm, dry and intact.   ? ?Results: ?U/A dipstick negative ? ?

## 2021-10-10 ENCOUNTER — Ambulatory Visit (INDEPENDENT_AMBULATORY_CARE_PROVIDER_SITE_OTHER): Payer: BC Managed Care – PPO | Admitting: Family Medicine

## 2021-10-10 ENCOUNTER — Encounter (HOSPITAL_BASED_OUTPATIENT_CLINIC_OR_DEPARTMENT_OTHER): Payer: Self-pay | Admitting: Family Medicine

## 2021-10-10 VITALS — BP 122/78 | HR 62 | Temp 97.7°F | Ht 74.0 in | Wt 217.4 lb

## 2021-10-10 DIAGNOSIS — R911 Solitary pulmonary nodule: Secondary | ICD-10-CM

## 2021-10-10 DIAGNOSIS — Z0000 Encounter for general adult medical examination without abnormal findings: Secondary | ICD-10-CM | POA: Insufficient documentation

## 2021-10-10 DIAGNOSIS — K639 Disease of intestine, unspecified: Secondary | ICD-10-CM | POA: Insufficient documentation

## 2021-10-10 NOTE — Patient Instructions (Signed)
Preventive Care 21-33 Years Old, Male Preventive care refers to lifestyle choices and visits with your health care provider that can promote health and wellness. Preventive care visits are also called wellness exams. What can I expect for my preventive care visit? Counseling During your preventive care visit, your health care provider may ask about your: Medical history, including: Past medical problems. Family medical history. Current health, including: Emotional well-being. Home life and relationship well-being. Sexual activity. Lifestyle, including: Alcohol, nicotine or tobacco, and drug use. Access to firearms. Diet, exercise, and sleep habits. Safety issues such as seatbelt and bike helmet use. Sunscreen use. Work and work environment. Physical exam Your health care provider may check your: Height and weight. These may be used to calculate your BMI (body mass index). BMI is a measurement that tells if you are at a healthy weight. Waist circumference. This measures the distance around your waistline. This measurement also tells if you are at a healthy weight and may help predict your risk of certain diseases, such as type 2 diabetes and high blood pressure. Heart rate and blood pressure. Body temperature.  Vaccines are usually given at various ages, according to a schedule. Your health care provider will recommend vaccines for you based on your age, medical history, and lifestyle or other factors, such as travel or where you work. What tests do I need? Screening Your health care provider may recommend screening tests for certain conditions. This may include: Lipid and cholesterol levels. Diabetes screening. This is done by checking your blood sugar (glucose) after you have not eaten for a while (fasting). Hepatitis B test. Hepatitis C test. HIV (human immunodeficiency virus) test. STI (sexually transmitted infection) testing, if you are at risk. Talk with your health care  provider about your test results, treatment options, and if necessary, the need for more tests. Follow these instructions at home: Eating and drinking  Eat a healthy diet that includes fresh fruits and vegetables, whole grains, lean protein, and low-fat dairy products. Drink enough fluid to keep your urine pale yellow. Take vitamin and mineral supplements as recommended by your health care provider. Do not drink alcohol if your health care provider tells you not to drink. If you drink alcohol: Limit how much you have to 0-2 drinks a day. Know how much alcohol is in your drink. In the [place] one drink equals one 12 oz bottle of beer (355 mL), one 5 oz glass of wine (148 mL), or one 1 oz glass of hard liquor (44 mL). Lifestyle Brush your teeth every morning and night with fluoride toothpaste. Floss one time each day. Exercise for at least 30 minutes 5 or more days each week. Do not use any products that contain nicotine or tobacco. These products include cigarettes, chewing tobacco, and vaping devices, such as e-cigarettes. If you need help quitting, ask your health care provider. Do not use drugs. If you are sexually active, practice safe sex. Use a condom or other form of protection to prevent STIs. Find healthy ways to manage stress, such as: Meditation, yoga, or listening to music. Journaling. Talking to a trusted person. Spending time with friends and family. Minimize exposure to UV radiation to reduce your risk of skin cancer. Safety Always wear your seat belt while driving or riding in a vehicle. Do not drive: If you have been drinking alcohol. Do not ride with someone who has been drinking. If you have been using any mind-altering substances or drugs. While texting. When you are tired or   distracted. Wear a helmet and other protective equipment during sports activities. If you have firearms in your house, make sure you follow all gun safety procedures. Seek help if you have been  physically or sexually abused. What's next? Go to your health care provider once a year for an annual wellness visit. Ask your health care provider how often you should have your eyes and teeth checked. Stay up to date on all vaccines. This information is not intended to replace advice given to you by your health care provider. Make sure you discuss any questions you have with your health care provider. Document Revised: 12/07/2020 Document Reviewed: 12/07/2020 Elsevier Patient Education  2023 [employer] Inc.  

## 2021-10-10 NOTE — Assessment & Plan Note (Signed)
Recent CT renal stone with mention of lesion near expected location of ileocecal junction.  No further discussion regarding significance of this finding ?We will refer to GI for further evaluation and recommendations ?

## 2021-10-10 NOTE — Progress Notes (Signed)
?Subjective:   ? ?CC: Annual Physical Exam ? ?HPI:  ?Walter Mendoza. is a 31 y.o. presenting for annual physical ? ?I reviewed the past medical history, family history, social history, surgical history, and allergies today and no changes were needed.  Please see the problem list section below in epic for further details. ? ?Past Medical History: ?Past Medical History:  ?Diagnosis Date  ? Kidney stones   ? ?Past Surgical History: ?History reviewed. No pertinent surgical history. ?Social History: ?Social History  ? ?Socioeconomic History  ? Marital status: Single  ?  Spouse name: Not on file  ? Number of children: Not on file  ? Years of education: Not on file  ? Highest education level: Not on file  ?Occupational History  ? Not on file  ?Tobacco Use  ? Smoking status: Never  ? Smokeless tobacco: Never  ?Vaping Use  ? Vaping Use: Never used  ?Substance and Sexual Activity  ? Alcohol use: Yes  ?  Alcohol/week: 1.0 - 2.0 standard drink  ?  Types: 1 - 2 Cans of beer per week  ? Drug use: Never  ? Sexual activity: Yes  ?Other Topics Concern  ? Not on file  ?Social History Narrative  ? Not on file  ? ?Social Determinants of Health  ? ?Financial Resource Strain: Not on file  ?Food Insecurity: Not on file  ?Transportation Needs: Not on file  ?Physical Activity: Not on file  ?Stress: Not on file  ?Social Connections: Not on file  ? ?Family History: ?Family History  ?Problem Relation Age of Onset  ? Hypertension Mother   ? Blindness Father   ? ?Allergies: ?Allergies  ?Allergen Reactions  ? Bee Venom Anaphylaxis  ? Penicillins Hives  ? ?Medications: See med rec. ? ?Review of Systems: No headache, visual changes, nausea, vomiting, diarrhea, constipation, dizziness, abdominal pain, skin rash, fevers, chills, night sweats, swollen lymph nodes, weight loss, chest pain, body aches, joint swelling, muscle aches, shortness of breath, mood changes, visual or auditory hallucinations. ? ?Objective:   ? ?BP 122/78   Pulse 62   Temp  97.7 ?F (36.5 ?C)   Ht 6\' 2"  (1.88 m)   Wt 217 lb 6.4 oz (98.6 kg)   SpO2 100%   BMI 27.91 kg/m?  ? ?General: Well Developed, well nourished, and in no acute distress.  ?Neuro: Alert and oriented x3, extra-ocular muscles intact, sensation grossly intact. Cranial nerves II through XII are intact, motor, sensory, and coordinative functions are all intact. ?HEENT: Normocephalic, atraumatic, pupils equal round reactive to light, neck supple, no masses, no lymphadenopathy, thyroid nonpalpable. Oropharynx, nasopharynx, external ear canals are unremarkable. ?Skin: Warm and dry, no rashes noted.  ?Cardiac: Regular rate and rhythm, no murmurs rubs or gallops.  ?Respiratory: Clear to auscultation bilaterally. Not using accessory muscles, speaking in full sentences.  ?Abdominal: Soft, nontender, nondistended, positive bowel sounds, no masses, no organomegaly.  ?Musculoskeletal: Shoulder, elbow, wrist, hip, knee, ankle stable, and with full range of motion. ? ?Impression and Recommendations:   ? ?Wellness examination ?Routine HCM labs reviewed. HCM reviewed/discussed. Anticipatory guidance regarding healthy weight, lifestyle and choices given. ?Recommend healthy diet.  Recommend approximately 150 minutes/week of moderate intensity exercise ?Recommend regular dental and vision exams ?Always use seatbelt/lap and shoulder restraints ?Recommend using smoke alarms and checking batteries at least twice a year ?Recommend using sunscreen when outside ?Tetanus recommended - patient declined today ? ?Small bowel lesion ?Recent CT renal stone with mention of lesion near expected location of ileocecal  junction.  No further discussion regarding significance of this finding ?We will refer to GI for further evaluation and recommendations ? ?Pulmonary nodule ?Noted on prior CT scan with recommendation for follow-up CT in 6 to 12 months.  6 months will be in early May.  Will place order now for patient to arrange for imaging study in the  coming weeks ? ?Plan for follow-up in 6 months, follow-up on blood pressure ? ? ?___________________________________________ ?Sukari Grist de [place], MD, ABFM, CAQSM ?Primary Care and Sports Medicine ?[facility] [place] ?

## 2021-10-10 NOTE — Assessment & Plan Note (Signed)
Noted on prior CT scan with recommendation for follow-up CT in 6 to 12 months.  6 months will be in early May.  Will place order now for patient to arrange for imaging study in the coming weeks ?

## 2021-10-10 NOTE — Assessment & Plan Note (Signed)
Routine HCM labs reviewed. HCM reviewed/discussed. Anticipatory guidance regarding healthy weight, lifestyle and choices given. ?Recommend healthy diet.  Recommend approximately 150 minutes/week of moderate intensity exercise ?Recommend regular dental and vision exams ?Always use seatbelt/lap and shoulder restraints ?Recommend using smoke alarms and checking batteries at least twice a year ?Recommend using sunscreen when outside ?Tetanus recommended - patient declined today ?

## 2021-10-16 ENCOUNTER — Telehealth (HOSPITAL_BASED_OUTPATIENT_CLINIC_OR_DEPARTMENT_OTHER): Payer: Self-pay | Admitting: Family Medicine

## 2021-10-16 NOTE — Telephone Encounter (Signed)
Received call from [place] letting us know hat pt cannot be scheduled for ordered CT being that it needed to be ordered without contrast. Spoke with representative Miriam. Please advise. ?

## 2021-10-17 NOTE — Addendum Note (Signed)
Addended by: DE Peru, Marcy Salvo J on: 10/17/2021 10:50 AM ? ? Modules accepted: Orders ? ?

## 2021-10-17 NOTE — Telephone Encounter (Signed)
Order updated without contrast ?

## 2021-11-03 ENCOUNTER — Encounter: Payer: Self-pay | Admitting: Physician Assistant

## 2021-11-06 ENCOUNTER — Inpatient Hospital Stay: Admission: RE | Admit: 2021-11-06 | Payer: BC Managed Care – PPO | Source: Ambulatory Visit

## 2021-11-23 ENCOUNTER — Ambulatory Visit: Payer: BC Managed Care – PPO | Admitting: Physician Assistant

## 2021-11-24 ENCOUNTER — Ambulatory Visit (INDEPENDENT_AMBULATORY_CARE_PROVIDER_SITE_OTHER): Payer: Medicare Other

## 2021-11-24 ENCOUNTER — Encounter (HOSPITAL_BASED_OUTPATIENT_CLINIC_OR_DEPARTMENT_OTHER): Payer: Self-pay | Admitting: Nurse Practitioner

## 2021-11-24 ENCOUNTER — Ambulatory Visit (INDEPENDENT_AMBULATORY_CARE_PROVIDER_SITE_OTHER): Payer: BC Managed Care – PPO | Admitting: Nurse Practitioner

## 2021-11-24 VITALS — BP 125/87 | HR 65 | Ht 74.0 in | Wt 218.5 lb

## 2021-11-24 DIAGNOSIS — B354 Tinea corporis: Secondary | ICD-10-CM

## 2021-11-24 DIAGNOSIS — M25531 Pain in right wrist: Secondary | ICD-10-CM

## 2021-11-24 MED ORDER — KETOCONAZOLE 2 % EX SHAM: MEDICATED_SHAMPOO | CUTANEOUS | 0 refills | Status: DC

## 2021-11-24 MED ORDER — CLOTRIMAZOLE-BETAMETHASONE 1-0.05 % EX CREA: 1.0000 "application " | TOPICAL_CREAM | Freq: Two times a day (BID) | CUTANEOUS | 2 refills | Status: DC

## 2021-11-24 MED ORDER — PREDNISONE 20 MG PO TABS: 40.0000 mg | ORAL_TABLET | Freq: Every day | ORAL | 0 refills | Status: DC

## 2021-11-24 MED ORDER — MELOXICAM 15 MG PO TABS: 15.0000 mg | ORAL_TABLET | Freq: Every day | ORAL | 0 refills | Status: DC

## 2021-11-24 NOTE — Assessment & Plan Note (Addendum)
Pain to the right wrist and hand acute in nature with no known injury.  Strongly suspect extensor tendinitis may be present based on the symptoms.  Given the patient's previous history of fracture discussed option of x-ray.  He would like to have this done today.  Recommend immobilization and NSAIDs for 3 to 4 days then begin stretching exercises.  Bracing provided today for patient.  Recommend he wear this for several weeks while working and sleeping. Discussed with patient that he may also use ice to help relieve pain and inflammation.  Note provided for work to prevent heavy lifting for at least the next 4 days.  He may need a longer span of limitations however he is concerned that he is unable to perform his job appropriately with these limitations. Recommend he return for repeat evaluation in approximately 2 weeks with PCP for further recommendations.  He may need formal physical therapy if symptoms persist.

## 2021-11-24 NOTE — Assessment & Plan Note (Signed)
Symptoms and presentation consistent with tinea corporis.  Erythematous papular rash with coalescence present on the right rib cage.  No signs of infection or drainage present.  Recommend treatment with ketoconazole shampoo to use as body wash 3 times a week and application of topical steroid and antifungal treatment daily. If no improvement of symptoms after 2 weeks of treatment patient will follow-up with PCP for further evaluation.

## 2021-11-24 NOTE — Progress Notes (Signed)
Tollie Eth, DNP, AGNP-c Primary Care & Sports Medicine [address]  [address] [place], [place] [address] [phone]  Subjective:   Pharaoh Pio. is a 31 y.o. male presents to day for rash and right hand pain. Rash Pruritic papular rash with coalescence noted on the left rib cage Patient reports that he noticed this several days ago Does not appear to be getting worse Has not tried anything for it Does not burn, sting, drain.  No pain to touch Never had anything like this before Right hand pain Endorses sudden onset of right wrist and hand pain particularly in the medial portion of both the palmar and dorsal surface of the right hand Patient reports onset of pain yesterday with no known injury Previous injury in childhood with fracture of the wrists bilaterally He has not had any chronic complications from this He reports that he lives heavy objects for work and noticed that while lifting the wrist and hand were painful Also endorses numbness and tingling in the palmar surface of the hand without radiation into the fingertips He tells me that he feels that when he places an object in the hand the muscles become weaker however when he removes the object strength returns He tells me that he was dropping things at work last night which prompted him to come in to have this evaluated  PMH, Medications, and Allergies reviewed and updated in chart.   ROS negative except for what is listed in HPI. Objective:  BP 125/87   Pulse 65   Ht 6\' 2"  (1.88 m)   Wt 218 lb 8 oz (99.1 kg)   SpO2 98%   BMI 28.05 kg/m  Physical Exam Vitals and nursing note reviewed.  Constitutional:      Appearance: Normal appearance.  HENT:     Head: Normocephalic.  Musculoskeletal:        General: Swelling and tenderness present.     Right wrist: Tenderness present. No deformity, snuff box tenderness or crepitus. Normal pulse.     Left wrist: Normal.     Right hand: Swelling  and tenderness present. No bony tenderness. Normal range of motion. Decreased strength. Normal strength of finger abduction, thumb/finger opposition and wrist extension. Normal sensation. There is no disruption of two-point discrimination. Normal capillary refill. Normal pulse.     Left hand: Normal.       Arms:     Comments: Mild edema noted to the right dorsal surface of the hand closer to the thumb and first 2 fingers.  Negative snuffbox tenderness.  Range of motion intact.  Individual finger strength and sensation intact.  Weakness is present with prolonged application of pressure with the hand.  Skin:    General: Skin is warm and dry.     Capillary Refill: Capillary refill takes less than 2 seconds.     Findings: Rash present.          Comments: Erythematous papular rash with coalescence noted to the right rib cage.  Area approximately 10 cm tall by 6 cm wide.  No excoriation, drainage, purulence, warmth present  Neurological:     General: No focal deficit present.     Mental Status: He is alert and oriented to person, place, and time.  Psychiatric:        Mood and Affect: Mood normal.        Behavior: Behavior normal.        Thought Content: Thought content normal.  Judgment: Judgment normal.          Assessment & Plan:   Problem List Items Addressed This Visit     Tinea corporis - Primary    Symptoms and presentation consistent with tinea corporis.  Erythematous papular rash with coalescence present on the right rib cage.  No signs of infection or drainage present.  Recommend treatment with ketoconazole shampoo to use as body wash 3 times a week and application of topical steroid and antifungal treatment daily. If no improvement of symptoms after 2 weeks of treatment patient will follow-up with PCP for further evaluation.       Relevant Medications   ketoconazole (NIZORAL) 2 % shampoo   clotrimazole-betamethasone (LOTRISONE) cream   Wrist pain, acute, right    Pain  to the right wrist and hand acute in nature with no known injury.  Strongly suspect extensor tendinitis may be present based on the symptoms.  Given the patient's previous history of fracture discussed option of x-ray.  He would like to have this done today.  Recommend immobilization and NSAIDs for 3 to 4 days then begin stretching exercises.  Bracing provided today for patient.  Recommend he wear this for several weeks while working and sleeping. Discussed with patient that he may also use ice to help relieve pain and inflammation.  Note provided for work to prevent heavy lifting for at least the next 4 days.  He may need a longer span of limitations however he is concerned that he is unable to perform his job appropriately with these limitations. Recommend he return for repeat evaluation in approximately 2 weeks with PCP for further recommendations.  He may need formal physical therapy if symptoms persist.       Relevant Medications   meloxicam (MOBIC) 15 MG tablet   predniSONE (DELTASONE) 20 MG tablet   Other Relevant Orders   DG Hand Complete Right     Tollie Eth, DNP, AGNP-c 11/24/2021  5:14 PM

## 2021-11-24 NOTE — Patient Instructions (Addendum)
I have sent in a body wash and cream for the rash.  You can use the body wash three days a week and the cream you can use every day. If this does not get any better in 2 weeks, please let us know.   I will get the x-ray of the hand and we will make sure nothing is fractured.   I want you to wear the brace for at least 3-4 days and then you can only wear it when working and when you sleep.   Use ice on the wrist and hand for 20 minutes 2-3 times a day for at least 2 days.  Start the stretches in 3 days or so as tolerated.

## 2021-11-27 ENCOUNTER — Encounter (HOSPITAL_BASED_OUTPATIENT_CLINIC_OR_DEPARTMENT_OTHER): Payer: Self-pay | Admitting: Nurse Practitioner

## 2021-11-27 ENCOUNTER — Other Ambulatory Visit (HOSPITAL_BASED_OUTPATIENT_CLINIC_OR_DEPARTMENT_OTHER): Payer: Self-pay

## 2021-11-27 DIAGNOSIS — S6991XA Unspecified injury of right wrist, hand and finger(s), initial encounter: Secondary | ICD-10-CM

## 2021-12-02 ENCOUNTER — Ambulatory Visit
Admission: RE | Admit: 2021-12-02 | Discharge: 2021-12-02 | Disposition: A | Discharge status: Home / Self Care | Payer: Medicare Other | Source: Ambulatory Visit | Attending: Urgent Care | Admitting: Urgent Care

## 2021-12-02 VITALS — BP 132/77 | HR 56 | Temp 98.3°F | Resp 18

## 2021-12-02 DIAGNOSIS — G8929 Other chronic pain: Secondary | ICD-10-CM

## 2021-12-02 DIAGNOSIS — R1030 Lower abdominal pain, unspecified: Secondary | ICD-10-CM

## 2021-12-02 DIAGNOSIS — R112 Nausea with vomiting, unspecified: Secondary | ICD-10-CM | POA: Diagnosis not present

## 2021-12-02 LAB — POCT URINALYSIS DIP (MANUAL ENTRY)
Blood, UA: NEGATIVE
Glucose, UA: NEGATIVE mg/dL
Ketones, POC UA: NEGATIVE mg/dL
Leukocytes, UA: NEGATIVE
Nitrite, UA: NEGATIVE
Protein Ur, POC: 30 mg/dL — AB
Spec Grav, UA: 1.025 (ref 1.010–1.025)
Urobilinogen, UA: 1 E.U./dL
pH, UA: 5.5 (ref 5.0–8.0)

## 2021-12-02 MED ORDER — ONDANSETRON 8 MG PO TBDP: 8.0000 mg | ORAL_TABLET | Freq: Three times a day (TID) | ORAL | 0 refills | Status: DC | PRN

## 2021-12-02 NOTE — Discharge Instructions (Signed)
Make sure you push fluids drinking mostly water but mix it with Gatorade.  Try to eat light meals including soups, broths and soft foods, fruits.  You may use Zofran for your nausea and vomiting once every 8 hours.   Please return to the clinic if symptoms worsen or you start having severe abdominal pain not helped by taking Tylenol or start having bloody stools or blood in the vomit.  

## 2021-12-02 NOTE — ED Triage Notes (Addendum)
Pt c/o abd pain/ cramping, vomiting, chills. This is not a new onset for the patient. He states he has an Appt with [facility]. The pt is on his last day of taking prednisone.  Started: Wednesday  Home interventions: Pepto bismol, gatorade

## 2021-12-02 NOTE — ED Provider Notes (Signed)
[facility]   MRN: [id number] DOB: 01-18-1991  Subjective:   Walter Fussell. is a 31 y.o. male presenting for recurrent and persistent nausea with vomiting, abdominal pain and cramping.  Has had this going on for months.  Has been drinking Gatorade and using Pepto-Bismol.  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  This has been resolved.  Patient is actually supposed to see a gastroenterologist as well as there was an abnormality seen in his colon.  No bloody stools, diarrhea, constipation, personal or family history of GI disorders.  No drug, marijuana use.  Patient is actually completed a course of prednisone for tendinitis.  No current facility-administered medications for this encounter.  Current Outpatient Medications:    predniSONE (DELTASONE) 20 MG tablet, Take 2 tablets (40 mg total) by mouth daily with breakfast., Disp: 10 tablet, Rfl: 0   clotrimazole-betamethasone (LOTRISONE) cream, Apply 1 application. topically 2 (two) times daily. Apply to rash twice a day as needed., Disp: 30 g, Rfl: 2   ketoconazole (NIZORAL) 2 % shampoo, Use as body wash for rash three days a week until clear., Disp: 120 mL, Rfl: 0   meloxicam (MOBIC) 15 MG tablet, Take 1 tablet (15 mg total) by mouth daily., Disp: 30 tablet, Rfl: 0   Allergies  Allergen Reactions   Bee Venom Anaphylaxis   Penicillins Hives    Past Medical History:  Diagnosis Date   Kidney stones      History reviewed. No pertinent surgical history.  Family History  Problem Relation Age of Onset   Hypertension Mother    Blindness Father     Social History   Tobacco Use   Smoking status: Never   Smokeless tobacco: Never  Vaping Use   Vaping Use: Never used  Substance Use Topics   Alcohol use: Yes    Alcohol/week: 1.0 - 2.0 standard drink of alcohol    Types: 1 - 2 Cans of beer  per week   Drug use: Never    ROS   Objective:   Vitals: BP 132/77 (BP Location: Left Arm)   Pulse (!) 56   Temp 98.3 F (36.8 C) (Oral)   Resp 18   SpO2 97%   Physical Exam Constitutional:      General: He is not in acute distress.    Appearance: Normal appearance. He is well-developed and normal weight. He is not ill-appearing, toxic-appearing or diaphoretic.  HENT:     Right Ear: External ear normal.     Left Ear: External ear normal.     Nose: Nose normal.     Mouth/Throat:     Mouth: Mucous membranes are moist.  Eyes:     General: No scleral icterus.       Right eye: No discharge.        Left eye: No discharge.     Extraocular Movements: Extraocular movements intact.     Conjunctiva/sclera: Conjunctivae normal.  Cardiovascular:     Rate and Rhythm: Normal rate and regular rhythm.     Heart sounds: No murmur heard.    No friction rub. No gallop.  Pulmonary:     Effort: No respiratory distress.     Breath sounds: No wheezing or rales.  Abdominal:     General: Bowel sounds are increased. There is no distension.     Palpations: Abdomen is soft. There is no  mass.     Tenderness: There is abdominal tenderness in the right lower quadrant and left lower quadrant. There is no right CVA tenderness, left CVA tenderness, guarding or rebound.  Skin:    General: Skin is warm and dry.  Neurological:     Mental Status: He is alert and oriented to person, place, and time.     Results for orders placed or performed during the hospital encounter of 12/02/21 (from the past 24 hour(s))  POCT urinalysis dipstick     Status: Abnormal   Collection Time: 12/02/21  9:28 AM  Result Value Ref Range   Color, UA other (A) yellow   Clarity, UA clear clear   Glucose, UA negative negative mg/dL   Bilirubin, UA small (A) negative   Ketones, POC UA negative negative mg/dL   Spec Grav, UA 1.287 8.676 - 1.025   Blood, UA negative negative   pH, UA 5.5 5.0 - 8.0   Protein Ur, POC =30  (A) negative mg/dL   Urobilinogen, UA 1.0 0.2 or 1.0 E.U./dL   Nitrite, UA Negative Negative   Leukocytes, UA Negative Negative    Assessment and Plan :   PDMP not reviewed this encounter.  1. Lower abdominal pain   2. Nausea and vomiting, unspecified vomiting type   3. Chronic abdominal pain    Suspect acute on chronic abdominal pain with nausea and vomiting.  However, this may be impacted by his current use of prednisone.  Recommend supportive care, Zofran.  Maintain follow-up with the GI specialist.  No signs of an acute abdomen on exam.  No signs of severe dehydration warranting IV fluids. Counseled patient on potential for adverse effects with medications prescribed/recommended today, ER and return-to-clinic precautions discussed, patient verbalized understanding.    Wallis Bamberg, [place] 12/02/21 [phone]

## 2021-12-03 ENCOUNTER — Other Ambulatory Visit: Payer: Self-pay

## 2021-12-03 ENCOUNTER — Emergency Department (HOSPITAL_BASED_OUTPATIENT_CLINIC_OR_DEPARTMENT_OTHER)
Admission: EM | Admit: 2021-12-03 | Discharge: 2021-12-04 | Disposition: A | Discharge status: Home / Self Care | Payer: BC Managed Care – PPO | Attending: Emergency Medicine | Admitting: Emergency Medicine

## 2021-12-03 ENCOUNTER — Encounter (HOSPITAL_BASED_OUTPATIENT_CLINIC_OR_DEPARTMENT_OTHER): Payer: Self-pay

## 2021-12-03 DIAGNOSIS — M79641 Pain in right hand: Secondary | ICD-10-CM | POA: Insufficient documentation

## 2021-12-03 DIAGNOSIS — M25531 Pain in right wrist: Secondary | ICD-10-CM | POA: Diagnosis not present

## 2021-12-03 DIAGNOSIS — R1084 Generalized abdominal pain: Secondary | ICD-10-CM | POA: Insufficient documentation

## 2021-12-03 DIAGNOSIS — R944 Abnormal results of kidney function studies: Secondary | ICD-10-CM | POA: Insufficient documentation

## 2021-12-03 LAB — CBC
HCT: 43 % (ref 39.0–52.0)
Hemoglobin: 14.6 g/dL (ref 13.0–17.0)
MCH: 30.4 pg (ref 26.0–34.0)
MCHC: 34 g/dL (ref 30.0–36.0)
MCV: 89.6 fL (ref 80.0–100.0)
Platelets: 286 10*3/uL (ref 150–400)
RBC: 4.8 MIL/uL (ref 4.22–5.81)
RDW: 11.5 % (ref 11.5–15.5)
WBC: 10.3 10*3/uL (ref 4.0–10.5)
nRBC: 0 % (ref 0.0–0.2)

## 2021-12-03 LAB — URINALYSIS, ROUTINE W REFLEX MICROSCOPIC
Bilirubin Urine: NEGATIVE
Glucose, UA: NEGATIVE mg/dL
Hgb urine dipstick: NEGATIVE
Ketones, ur: NEGATIVE mg/dL
Leukocytes,Ua: NEGATIVE
Nitrite: NEGATIVE
Specific Gravity, Urine: 1.027 (ref 1.005–1.030)
pH: 6.5 (ref 5.0–8.0)

## 2021-12-03 LAB — COMPREHENSIVE METABOLIC PANEL
ALT: 17 U/L (ref 0–44)
AST: 15 U/L (ref 15–41)
Albumin: 4.2 g/dL (ref 3.5–5.0)
Alkaline Phosphatase: 43 U/L (ref 38–126)
Anion gap: 10 (ref 5–15)
BUN: 14 mg/dL (ref 6–20)
CO2: 28 mmol/L (ref 22–32)
Calcium: 9.8 mg/dL (ref 8.9–10.3)
Chloride: 98 mmol/L (ref 98–111)
Creatinine, Ser: 1.28 mg/dL — ABNORMAL HIGH (ref 0.61–1.24)
GFR, Estimated: >60 mL/min (ref >60)
Glucose, Bld: 88 mg/dL (ref 70–99)
Potassium: 3.8 mmol/L (ref 3.5–5.1)
Sodium: 136 mmol/L (ref 135–145)
Total Bilirubin: 0.7 mg/dL (ref 0.3–1.2)
Total Protein: 7.6 g/dL (ref 6.5–8.1)

## 2021-12-03 LAB — LIPASE, BLOOD: Lipase: 11 U/L (ref 11–51)

## 2021-12-03 MED ORDER — KETOROLAC TROMETHAMINE 30 MG/ML IJ SOLN
30.0000 mg | Freq: Once | INTRAMUSCULAR | Status: AC
  Administered 2021-12-03: 30 mg via INTRAVENOUS
  Filled 2021-12-03: qty 1

## 2021-12-03 MED ORDER — HALOPERIDOL LACTATE 5 MG/ML IJ SOLN
5.0000 mg | Freq: Once | INTRAMUSCULAR | Status: AC
  Administered 2021-12-03: 5 mg via INTRAVENOUS
  Filled 2021-12-03: qty 1

## 2021-12-03 NOTE — ED Notes (Signed)
Pt went to restroom in lobby and vomited; states he got hot and felt like he was going to pass out. Pt alert & oriented, nad noted at this time. VS wnl.

## 2021-12-03 NOTE — ED Triage Notes (Signed)
POV, pt c/o diffuse abd pain that began on Wednesday, couple mths ago he had same problem, sts vomiting after eating but can tolerate water and small amount of gatorade, one episode of diarrhea. Seen yesterday at urgent care gave him zofran and sts it helps some. Alert and oriented x 4, amb to triage.

## 2021-12-04 ENCOUNTER — Encounter (HOSPITAL_BASED_OUTPATIENT_CLINIC_OR_DEPARTMENT_OTHER): Payer: Self-pay | Admitting: Emergency Medicine

## 2021-12-04 MED ORDER — OMEPRAZOLE 20 MG PO CPDR: 20.0000 mg | DELAYED_RELEASE_CAPSULE | Freq: Every day | ORAL | 0 refills | Status: DC

## 2021-12-04 NOTE — ED Notes (Signed)
Pt verbalizes understanding of discharge instructions. Opportunity for questioning and answers were provided. Pt discharged from ED to home with family.    

## 2021-12-04 NOTE — ED Provider Notes (Signed)
[facility]   CSN: [id number] Arrival date & time: 12/03/21  1946     History  Chief Complaint  Patient presents with   Abdominal Pain    Walter CruzDavid Nickles Jr. is a 31 y.o. male.  The history is provided by the patient.  Abdominal Pain Pain location:  Generalized Pain quality: cramping   Pain radiates to:  Does not radiate Pain severity:  Moderate Onset quality:  Gradual Duration: days. Timing:  Constant Progression:  Unchanged Chronicity:  Recurrent Context: not alcohol use   Relieved by:  Nothing Worsened by:  Nothing Ineffective treatments:  None tried Associated symptoms: nausea and vomiting   Associated symptoms: no fever   Risk factors: has not had multiple surgeries        Home Medications Prior to Admission medications   Medication Sig Start Date End Date Taking? Authorizing Provider  clotrimazole-betamethasone (LOTRISONE) cream Apply 1 application. topically 2 (two) times daily. Apply to rash twice a day as needed. 11/24/21   Tollie EthEarly, Sara E, NP  ketoconazole (NIZORAL) 2 % shampoo Use as body wash for rash three days a week until clear. 11/24/21   Tollie EthEarly, Sara E, NP  meloxicam (MOBIC) 15 MG tablet Take 1 tablet (15 mg total) by mouth daily. 11/24/21   Tollie EthEarly, Sara E, NP  ondansetron (ZOFRAN-ODT) 8 MG disintegrating tablet Take 1 tablet (8 mg total) by mouth every 8 (eight) hours as needed for nausea or vomiting. 12/02/21   Wallis BambergMani, Mario, PA-C  predniSONE (DELTASONE) 20 MG tablet Take 2 tablets (40 mg total) by mouth daily with breakfast. 11/24/21   Early, Sung AmabileSara E, NP      Allergies    Bee venom and Penicillins    Review of Systems   Review of Systems  Constitutional:  Negative for fever.  HENT:  Negative for facial swelling.   Eyes:  Negative for redness.  Respiratory:  Negative for stridor.   Cardiovascular:  Negative for leg swelling.  Gastrointestinal:  Positive for abdominal pain, nausea and vomiting.  All other systems  reviewed and are negative.   Physical Exam Updated Vital Signs BP 136/88   Pulse (!) 57   Temp 99 F (37.2 C) (Oral)   Resp 18   Ht 6\' 2"  (1.88 m)   Wt 97.5 kg   SpO2 100%   BMI 27.60 kg/m  Physical Exam Vitals and nursing note reviewed.  Constitutional:      General: He is not in acute distress.    Appearance: He is well-developed.  HENT:     Head: Normocephalic and atraumatic.     Nose: Nose normal.  Eyes:     Conjunctiva/sclera: Conjunctivae normal.     Pupils: Pupils are equal, round, and reactive to light.     Comments: Normal appearance  Cardiovascular:     Rate and Rhythm: Normal rate and regular rhythm.     Pulses: Normal pulses.     Heart sounds: Normal heart sounds.  Pulmonary:     Effort: Pulmonary effort is normal. No respiratory distress.     Breath sounds: Normal breath sounds.  Abdominal:     General: Abdomen is flat. Bowel sounds are normal. There is no distension.     Palpations: Abdomen is soft. There is no mass.     Tenderness: There is no abdominal tenderness. There is no guarding or rebound.  Genitourinary:    Comments: No CVA tenderness Musculoskeletal:        General: Normal range  of motion.     Cervical back: Normal range of motion.  Skin:    General: Skin is warm and dry.     Capillary Refill: Capillary refill takes less than 2 seconds.     Findings: No rash.  Neurological:     General: No focal deficit present.     Mental Status: He is alert and oriented to person, place, and time.  Psychiatric:        Behavior: Behavior normal.     ED Results / Procedures / Treatments   Labs (all labs ordered are listed, but only abnormal results are displayed) Results for orders placed or performed during the hospital encounter of 12/03/21  Lipase, blood  Result Value Ref Range   Lipase 11 11 - 51 U/L  Comprehensive metabolic panel  Result Value Ref Range   Sodium 136 135 - 145 mmol/L   Potassium 3.8 3.5 - 5.1 mmol/L   Chloride 98 98 - 111  mmol/L   CO2 28 22 - 32 mmol/L   Glucose, Bld 88 70 - 99 mg/dL   BUN 14 6 - 20 mg/dL   Creatinine, Ser 6.00 (H) 0.61 - 1.24 mg/dL   Calcium 9.8 8.9 - 45.9 mg/dL   Total Protein 7.6 6.5 - 8.1 g/dL   Albumin 4.2 3.5 - 5.0 g/dL   AST 15 15 - 41 U/L   ALT 17 0 - 44 U/L   Alkaline Phosphatase 43 38 - 126 U/L   Total Bilirubin 0.7 0.3 - 1.2 mg/dL   GFR, Estimated >97 >74 mL/min   Anion gap 10 5 - 15  CBC  Result Value Ref Range   WBC 10.3 4.0 - 10.5 K/uL   RBC 4.80 4.22 - 5.81 MIL/uL   Hemoglobin 14.6 13.0 - 17.0 g/dL   HCT 14.2 39.5 - 32.0 %   MCV 89.6 80.0 - 100.0 fL   MCH 30.4 26.0 - 34.0 pg   MCHC 34.0 30.0 - 36.0 g/dL   RDW 23.3 43.5 - 68.6 %   Platelets 286 150 - 400 K/uL   nRBC 0.0 0.0 - 0.2 %  Urinalysis, Routine w reflex microscopic  Result Value Ref Range   Color, Urine YELLOW YELLOW   APPearance CLEAR CLEAR   Specific Gravity, Urine 1.027 1.005 - 1.030   pH 6.5 5.0 - 8.0   Glucose, UA NEGATIVE NEGATIVE mg/dL   Hgb urine dipstick NEGATIVE NEGATIVE   Bilirubin Urine NEGATIVE NEGATIVE   Ketones, ur NEGATIVE NEGATIVE mg/dL   Protein, ur TRACE (A) NEGATIVE mg/dL   Nitrite NEGATIVE NEGATIVE   Leukocytes,Ua NEGATIVE NEGATIVE   DG Hand Complete Right  Result Date: 11/26/2021 CLINICAL DATA:  Right hand pain and weakness. Swelling. No known injury. Acute right wrist pain EXAM: RIGHT HAND - COMPLETE 3+ VIEW COMPARISON:  None Available. FINDINGS: There is widening of the scapholunate interval at 7 mm, although assessment is limited due to positioning. Hand alignment is otherwise normal. The joint spaces are otherwise normal. Well corticated density adjacent to the ulna styloid suggestive of remote prior injury or accessory ossicle. No fracture. No erosion, periostitis or focal bone abnormality. There is mild swelling about the proximal interphalangeal joints, most prominently affecting the third digit. No soft tissue calcifications. IMPRESSION: 1. Soft tissue swelling about the  proximal interphalangeal joints, most prominently affecting the third digit. No underlying osseous abnormality. 2. Widening of the scapholunate interval at 7 mm, suggestive of ligamentous injury/degeneration. Electronically Signed   By: Ivette Loyal.D.  On: 11/26/2021 16:50      Radiology No results found.  Procedures Procedures    Medications Ordered in ED Medications  haloperidol lactate (HALDOL) injection 5 mg (5 mg Intravenous Given 12/03/21 2309)  ketorolac (TORADOL) 30 MG/ML injection 30 mg (30 mg Intravenous Given 12/03/21 2309)    ED Course/ Medical Decision Making/ A&P                           Medical Decision Making Recurrent abdominal pain with vomiting.  Seeing GI in July.    Amount and/or Complexity of Data Reviewed Independent Historian: spouse    Details: see above External Data Reviewed: notes.    Details: previous notes reviewed. Labs: ordered.    Details: all labs reviewed: lipase 11 normal, normal sodium and potassium,  creatinine slightly elelvated. Normal LFTs, normal urine.  Risk Prescription drug management. Risk Details: Treated in the ED with resolution of symptoms.  Exam vitals and labs are benign and reassuring.  PO challenged successfully will start PPI.  Strict return precautions given.      Final Clinical Impression(s) / ED Diagnoses Final diagnoses:  None   Return for intractable cough, coughing up blood, fevers > 100.4 unrelieved by medication, shortness of breath, intractable vomiting, chest pain, shortness of breath, weakness, numbness, changes in speech, facial asymmetry, abdominal pain, passing out, Inability to tolerate liquids or food, cough, altered mental status or any concerns. No signs of systemic illness or infection. The patient is nontoxic-appearing on exam and vital signs are within normal limits.  I have reviewed the triage vital signs and the nursing notes. Pertinent labs & imaging results that were available during my  care of the patient were reviewed by me and considered in my medical decision making (see chart for details). After history, exam, and medical workup I feel the patient has been appropriately medically screened and is safe for discharge home. Pertinent diagnoses were discussed with the patient. Patient was given return precautions Rx / DC Orders ED Discharge Orders     None         Dontaye Hur, MD 12/04/21 9937

## 2021-12-07 ENCOUNTER — Ambulatory Visit: Payer: Medicare Other | Admitting: Orthopedic Surgery

## 2021-12-08 ENCOUNTER — Ambulatory Visit (HOSPITAL_BASED_OUTPATIENT_CLINIC_OR_DEPARTMENT_OTHER): Payer: Medicare Other | Admitting: Family Medicine

## 2022-01-03 ENCOUNTER — Ambulatory Visit: Payer: BC Managed Care – PPO | Admitting: Physician Assistant

## 2022-01-16 ENCOUNTER — Encounter (HOSPITAL_BASED_OUTPATIENT_CLINIC_OR_DEPARTMENT_OTHER): Payer: Self-pay | Admitting: Family Medicine

## 2022-02-19 ENCOUNTER — Ambulatory Visit (INDEPENDENT_AMBULATORY_CARE_PROVIDER_SITE_OTHER): Payer: Medicare Other | Admitting: Physician Assistant

## 2022-02-19 ENCOUNTER — Encounter: Payer: Self-pay | Admitting: Physician Assistant

## 2022-02-19 VITALS — BP 120/80 | HR 63 | Ht 74.0 in | Wt 212.6 lb

## 2022-02-19 DIAGNOSIS — R1013 Epigastric pain: Secondary | ICD-10-CM | POA: Diagnosis not present

## 2022-02-19 DIAGNOSIS — R935 Abnormal findings on diagnostic imaging of other abdominal regions, including retroperitoneum: Secondary | ICD-10-CM | POA: Diagnosis not present

## 2022-02-19 DIAGNOSIS — R112 Nausea with vomiting, unspecified: Secondary | ICD-10-CM

## 2022-02-19 MED ORDER — NA SULFATE-K SULFATE-MG SULF 17.5-3.13-1.6 GM/177ML PO SOLN
1.0000 | Freq: Once | ORAL | 0 refills | Status: AC
Start: 2022-02-19 — End: 2022-02-19

## 2022-02-19 NOTE — Patient Instructions (Signed)
You have been scheduled for an endoscopy and colonoscopy. Please follow the written instructions given to you at your visit today. Please pick up your prep supplies at the pharmacy within the next 1-3 days. If you use inhalers (even only as needed), please bring them with you on the day of your procedure.  _______________________________________________________  If you are age 33 or older, your body mass index should be between 23-30. Your Body mass index is 27.3 kg/m. If this is out of the aforementioned range listed, please consider follow up with your Primary Care Provider.  If you are age 50 or younger, your body mass index should be between 19-25. Your Body mass index is 27.3 kg/m. If this is out of the aformentioned range listed, please consider follow up with your Primary Care Provider.   ________________________________________________________  The [place] [facility] providers would like to encourage you to use [facility] to communicate with providers for non-urgent requests or questions.  Due to long hold times on the telephone, sending your provider a message by [facility] may be a faster and more efficient way to get a response.  Please allow 48 business hours for a response.  Please remember that this is for non-urgent requests.  _______________________________________________________

## 2022-02-19 NOTE — Progress Notes (Addendum)
Chief Complaint: Follow up Er visit for abdominal pain  HPI:    [name]. Is a 31 year old African-American male with a past medical history as listed below, who was referred to me by de Peru, Buren Kos, MD for follow-up after being seen in the ER for abdominal pain.      09/21/2021 CT renal stone study with 0.5 cm right inferior renal collecting system nonobstructing nephrolith.  At that time within the right lower quadrant circumscribed sub-1 cm rounded lesion was at the expected location of the ileocecal junction.    12/02/2021 patient seen in the urgent care with recurrent and persistent nausea with vomiting, abdominal pain and cramping.  Apparently had been going on for months.  At that time recommended supportive care with Zofran.    12/03/2021 patient seen in the ER for abdominal pain.  At that time labs with a lipase of 11, CMP with a creatinine minimally elevated at 1.28 and otherwise normal, normal CBC.  Patient was given Haldol and Toradol.    Today, the patient is seen along with his wife on the phone who assists with history.  She explains that since March of this year the patient has been going about every other month with symptoms of nausea, vomiting and abdominal cramping.  He has been seen in the urgent care and ER on 2 separate occasions for this.  Initially thought related to a kidney stone, but this was taken care of and patient had another episode in June.  He tells me that the thing that helped the most was Pepcid.  Within a few hours of using this almost all of his symptoms resolved.  He has not had any trouble recently.  His wife also asked about the "lesion" found at time of recent CT.      Patient seen this is 51 months old daughter in the room.    Denies fever, chills, weight loss, blood in his stool, family history of colon cancer, continued abdominal pain, heartburn or reflux.  Past Medical History:  Diagnosis Date   Kidney stones     No past surgical history on  file.  Current Outpatient Medications  Medication Sig Dispense Refill   clotrimazole-betamethasone (LOTRISONE) cream Apply 1 application. topically 2 (two) times daily. Apply to rash twice a day as needed. 30 g 2   ketoconazole (NIZORAL) 2 % shampoo Use as body wash for rash three days a week until clear. 120 mL 0   meloxicam (MOBIC) 15 MG tablet Take 1 tablet (15 mg total) by mouth daily. 30 tablet 0   omeprazole (PRILOSEC) 20 MG capsule Take 1 capsule (20 mg total) by mouth daily. 30 capsule 0   ondansetron (ZOFRAN-ODT) 8 MG disintegrating tablet Take 1 tablet (8 mg total) by mouth every 8 (eight) hours as needed for nausea or vomiting. 20 tablet 0   predniSONE (DELTASONE) 20 MG tablet Take 2 tablets (40 mg total) by mouth daily with breakfast. 10 tablet 0   No current facility-administered medications for this visit.    Allergies as of 02/19/2022 - Review Complete 12/04/2021  Allergen Reaction Noted   Bee venom Anaphylaxis 09/21/2019   Penicillins Hives 09/21/2019    Family History  Problem Relation Age of Onset   Hypertension Mother    Blindness Father     Social History   Socioeconomic History   Marital status: Single    Spouse name: Not on file   Number of children: Not on file  Years of education: Not on file   Highest education level: Not on file  Occupational History   Not on file  Tobacco Use   Smoking status: Never   Smokeless tobacco: Never  Vaping Use   Vaping Use: Never used  Substance and Sexual Activity   Alcohol use: Yes    Alcohol/week: 1.0 - 2.0 standard drink of alcohol    Types: 1 - 2 Cans of beer per week   Drug use: Never   Sexual activity: Yes  Other Topics Concern   Not on file  Social History Narrative   Not on file   Social Determinants of Health   Financial Resource Strain: Not on file  Food Insecurity: Not on file  Transportation Needs: Not on file  Physical Activity: Not on file  Stress: Not on file  Social Connections: Not on  file  Intimate Partner Violence: Not on file    Review of Systems:    Constitutional: No weight loss, fever or chills Skin: No rash Cardiovascular: No chest pain  Respiratory: No SOB  Gastrointestinal: See HPI and otherwise negative Genitourinary: No dysuria  Neurological: No headache, dizziness or syncope Musculoskeletal: No new muscle or joint pain Hematologic: No bleeding  Psychiatric: No history of depression or anxiety   Physical Exam:  Vital signs: BP 120/80   Pulse 63   Ht 6\' 2"  (1.88 m)   Wt 212 lb 9.6 oz (96.4 kg)   BMI 27.30 kg/m    Constitutional:   Pleasant AA male appears to be in NAD, Well developed, Well nourished, alert and cooperative Head:  Normocephalic and atraumatic. Eyes:   PEERL, EOMI. No icterus. Conjunctiva pink. Ears:  Normal auditory acuity. Neck:  Supple Throat: Oral cavity and pharynx without inflammation, swelling or lesion.  Respiratory: Respirations even and unlabored. Lungs clear to auscultation bilaterally.   No wheezes, crackles, or rhonchi.  Cardiovascular: Normal S1, S2. No MRG. Regular rate and rhythm. No peripheral edema, cyanosis or pallor.  Gastrointestinal:  Soft, nondistended, nontender. No rebound or guarding. Normal bowel sounds. No appreciable masses or hepatomegaly. Rectal:  Not performed.  Msk:  Symmetrical without gross deformities. Without edema, no deformity or joint abnormality.  Neurologic:  Alert and  oriented x4;  grossly normal neurologically.  Skin:   Dry and intact without significant lesions or rashes. Psychiatric: Demonstrates good judgement and reason without abnormal affect or behaviors.  RELEVANT LABS AND IMAGING: CBC    Component Value Date/Time   WBC 10.3 12/03/2021 2006   RBC 4.80 12/03/2021 2006   HGB 14.6 12/03/2021 2006   HGB 14.5 07/12/2021 1030   HCT 43.0 12/03/2021 2006   HCT 43.8 07/12/2021 1030   PLT 286 12/03/2021 2006   PLT 260 07/12/2021 1030   MCV 89.6 12/03/2021 2006   MCV 90 07/12/2021  1030   MCH 30.4 12/03/2021 2006   MCHC 34.0 12/03/2021 2006   RDW 11.5 12/03/2021 2006   RDW 12.0 07/12/2021 1030   LYMPHSABS 2.2 07/12/2021 1030   MONOABS 0.6 04/26/2021 1244   EOSABS 0.7 (H) 07/12/2021 1030   BASOSABS 0.1 07/12/2021 1030    CMP     Component Value Date/Time   NA 136 12/03/2021 2006   NA 138 08/22/2021 1439   K 3.8 12/03/2021 2006   CL 98 12/03/2021 2006   CO2 28 12/03/2021 2006   GLUCOSE 88 12/03/2021 2006   BUN 14 12/03/2021 2006   BUN 18 08/22/2021 1439   CREATININE 1.28 (H) 12/03/2021 2006  CALCIUM 9.8 12/03/2021 2006   PROT 7.6 12/03/2021 2006   PROT 7.5 07/12/2021 1030   ALBUMIN 4.2 12/03/2021 2006   ALBUMIN 4.4 07/12/2021 1030   AST 15 12/03/2021 2006   ALT 17 12/03/2021 2006   ALKPHOS 43 12/03/2021 2006   BILITOT 0.7 12/03/2021 2006   BILITOT 0.4 07/12/2021 1030   GFRNONAA >60 12/03/2021 2006    Assessment: 1.  Abnormal CT of the abdomen: With sub-1 cm rounded lesion at the expected location of the ileocecal junction, no family history of colon cancer, no change in bowel habits or blood in his stool or weight loss; consider benign lesion versus other 2.  Cyclical nausea vomiting and abdominal pain: Initial imaging with kidney stone in March which was thought to be the cause, symptoms continued, better with Pepcid per patient, wife concerned we will continue every other month; consider most likely gastritis given benefit from Pepcid  Plan: 1.  Scheduled patient for diagnostic EGD and colonoscopy in the [facility] with Dr. Lavon Paganini.  Did provide the patient a detailed list of risks for the procedures and he agrees to proceed. Patient is appropriate for endoscopic procedure(s) in the ambulatory ([facility]) setting.  2.  Patient will continue to use Pepcid if this is helpful for his symptoms. 3.  Patient to follow in clinic per recommendations after procedures above.  Hyacinth Meeker, PA-C [facility] 02/19/2022, 2:03 PM  Cc: de Peru, Raymond J,  MD

## 2022-02-21 ENCOUNTER — Telehealth: Payer: Self-pay | Admitting: *Deleted

## 2022-02-21 NOTE — Telephone Encounter (Signed)
-----   Message from Unk Lightning, [place] sent at 02/20/2022  4:19 PM EDT ----- Regarding: FW: supervising? Herbert Seta can you please reschedule procedure to dr Lavon Paganini please.  Thank you JLL ----- Message ----- From: Sherrilyn Rist, MD Sent: 02/20/2022   4:04 PM EDT To: Unk Lightning, PA Subject: RE: supervising?                               Yes, he needs to go to a [place] [facility] date with the office note routed to her.  Thanks  - HD ----- Message ----- From: Unk Lightning, PA Sent: 02/20/2022   9:31 AM EDT To: Sherrilyn Rist, MD Subject: RE: supervising?                               I am so sorry I trusted my CMA yesterday.  It was Dr. Lavon Paganini. Would you like me to cancel procedure and change note?  JLL ----- Message ----- From: Sherrilyn Rist, MD Sent: 02/19/2022   5:13 PM EDT To: Unk Lightning, PA Subject: supervising?                                   Victorino Dike,    Did I get this guy because I had the next available procedures and there was some urgency? Because I was not supervising doc today.  - HD

## 2022-02-21 NOTE — Telephone Encounter (Signed)
Patient informed of error and switched to Dr. Lavon Paganini on 9/18 @ 2:30 pm.

## 2022-02-23 NOTE — Addendum Note (Signed)
Addended by: Mariane Duval on: 02/23/2022 03:47 PM   Modules accepted: Orders

## 2022-03-08 ENCOUNTER — Encounter: Payer: Medicare Other | Admitting: Gastroenterology

## 2022-03-12 ENCOUNTER — Ambulatory Visit (AMBULATORY_SURGERY_CENTER): Payer: BC Managed Care – PPO | Admitting: Gastroenterology

## 2022-03-12 ENCOUNTER — Encounter: Payer: Self-pay | Admitting: Gastroenterology

## 2022-03-12 VITALS — BP 116/78 | HR 58 | Temp 98.1°F | Resp 12 | Ht 74.0 in | Wt 212.0 lb

## 2022-03-12 DIAGNOSIS — K2950 Unspecified chronic gastritis without bleeding: Secondary | ICD-10-CM

## 2022-03-12 DIAGNOSIS — B9681 Helicobacter pylori [H. pylori] as the cause of diseases classified elsewhere: Secondary | ICD-10-CM

## 2022-03-12 DIAGNOSIS — R1013 Epigastric pain: Secondary | ICD-10-CM | POA: Diagnosis not present

## 2022-03-12 DIAGNOSIS — K6389 Other specified diseases of intestine: Secondary | ICD-10-CM | POA: Diagnosis not present

## 2022-03-12 DIAGNOSIS — K2960 Other gastritis without bleeding: Secondary | ICD-10-CM

## 2022-03-12 DIAGNOSIS — R112 Nausea with vomiting, unspecified: Secondary | ICD-10-CM | POA: Diagnosis not present

## 2022-03-12 DIAGNOSIS — R935 Abnormal findings on diagnostic imaging of other abdominal regions, including retroperitoneum: Secondary | ICD-10-CM | POA: Diagnosis not present

## 2022-03-12 MED ORDER — SODIUM CHLORIDE 0.9 % IV SOLN: 500.0000 mL | Freq: Once | INTRAVENOUS | Status: DC

## 2022-03-12 NOTE — Progress Notes (Signed)
VSS, transported to PACU °

## 2022-03-12 NOTE — Progress Notes (Signed)
Called to room to assist during endoscopic procedure.  Patient ID and intended procedure confirmed with present staff. Received instructions for my participation in the procedure from the performing physician.  

## 2022-03-12 NOTE — Op Note (Signed)
[place] Patient Name: Walter Mendoza Procedure Date: 03/12/2022 2:50 PM MRN: [id number] Endoscopist: Mauri Pole , MD Age: 31 Referring MD:  Date of Birth: December 28, 1990 Gender: Male Account #: [id number] Procedure:                Colonoscopy Indications:              Abnormal CT of the GI tract Medicines:                Propofol per Anesthesia Procedure:                Pre-Anesthesia Assessment:                           - Prior to the procedure, a History and Physical                            was performed, and patient medications and                            allergies were reviewed. The patient's tolerance of                            previous anesthesia was also reviewed. The risks                            and benefits of the procedure and the sedation                            options and risks were discussed with the patient.                            All questions were answered, and informed consent                            was obtained. Prior Anticoagulants: The patient has                            taken no previous anticoagulant or antiplatelet                            agents. ASA Grade Assessment: II - A patient with                            mild systemic disease. After reviewing the risks                            and benefits, the patient was deemed in                            satisfactory condition to undergo the procedure.                           After obtaining informed consent, the colonoscope  was passed under direct vision. Throughout the                            procedure, the patient's blood pressure, pulse, and                            oxygen saturations were monitored continuously. The                            Olympus [id number]-[id number]) Colonoscope was                            introduced through the anus and advanced to the the                            cecum, identified by appendiceal  orifice and                            ileocecal valve. The colonoscopy was performed                            without difficulty. The patient tolerated the                            procedure well. The quality of the bowel                            preparation was adequate. The ileocecal valve,                            appendiceal orifice, and rectum were photographed. Scope In: 3:05:00 PM Scope Out: 3:25:37 PM Scope Withdrawal Time: 0 hours 14 minutes 46 seconds  Total Procedure Duration: 0 hours 20 minutes 37 seconds  Findings:                 The perianal and digital rectal examinations were                            normal.                           The ileocecal valve was slightly lipomatous.                           The exam was otherwise without abnormality. Complications:            No immediate complications. Estimated Blood Loss:     Estimated blood loss: none. Impression:               - Lipomatous ileocecal valve.                           - The examination was otherwise normal.                           - No specimens collected. Recommendation:           -  Patient has a contact number available for                            emergencies. The signs and symptoms of potential                            delayed complications were discussed with the                            patient. Return to normal activities tomorrow.                            Written discharge instructions were provided to the                            patient.                           - Resume previous diet.                           - Continue present medications.                           - Repeat colonoscopy at age 64 for screening                            purposes.                           - See the other procedure note for documentation of                            additional recommendations. Mauri Pole, MD 03/12/2022 3:30:58 PM This report has been signed electronically.

## 2022-03-12 NOTE — Progress Notes (Signed)
r 

## 2022-03-12 NOTE — Progress Notes (Signed)
Please refer to office visit note 02/19/22 by Ellouise Newer. No additional changes in H&P Patient is appropriate for planned procedure(s) and anesthesia in an ambulatory setting  K. Denzil Magnuson , MD [phone]

## 2022-03-12 NOTE — Patient Instructions (Signed)
Handout provided about gastritis.  Resume previous diet; continue present medications.  Await pathology results. Repeat colonoscopy at age 31 for screening purposes.    YOU HAD AN ENDOSCOPIC PROCEDURE TODAY AT [facility]:   Refer to the procedure report that was given to you for any specific questions about what was found during the examination.  If the procedure report does not answer your questions, please call your gastroenterologist to clarify.  If you requested that your care partner not be given the details of your procedure findings, then the procedure report has been included in a sealed envelope for you to review at your convenience later.  YOU SHOULD EXPECT: Some feelings of bloating in the abdomen. Passage of more gas than usual.  Walking can help get rid of the air that was put into your GI tract during the procedure and reduce the bloating. If you had a lower endoscopy (such as a colonoscopy or flexible sigmoidoscopy) you may notice spotting of blood in your stool or on the toilet paper. If you underwent a bowel prep for your procedure, you may not have a normal bowel movement for a few days.  Please Note:  You might notice some irritation and congestion in your nose or some drainage.  This is from the oxygen used during your procedure.  There is no need for concern and it should clear up in a day or so.  SYMPTOMS TO REPORT IMMEDIATELY:  Following lower endoscopy (colonoscopy or flexible sigmoidoscopy):  Excessive amounts of blood in the stool  Significant tenderness or worsening of abdominal pains  Swelling of the abdomen that is new, acute  Fever of 100F or higher  Following upper endoscopy (EGD)  Vomiting of blood or coffee ground material  New chest pain or pain under the shoulder blades  Painful or persistently difficult swallowing  New shortness of breath  Fever of 100F or higher  Black, tarry-looking stools  For urgent or emergent issues, a  gastroenterologist can be reached at any hour by calling [phone]. Do not use MyChart messaging for urgent concerns.    DIET:  We do recommend a small meal at first, but then you may proceed to your regular diet.  Drink plenty of fluids but you should avoid alcoholic beverages for 24 hours.  ACTIVITY:  You should plan to take it easy for the rest of today and you should NOT DRIVE or use heavy machinery until tomorrow (because of the sedation medicines used during the test).    FOLLOW UP: Our staff will call the number listed on your records the next business day following your procedure.  We will call around 7:15- 8:00 am to check on you and address any questions or concerns that you may have regarding the information given to you following your procedure. If we do not reach you, we will leave a message.     If any biopsies were taken you will be contacted by phone or by letter within the next 1-3 weeks.  Please call us at [phone] if you have not heard about the biopsies in 3 weeks.    SIGNATURES/CONFIDENTIALITY: You and/or your care partner have signed paperwork which will be entered into your electronic medical record.  These signatures attest to the fact that that the information above on your After Visit Summary has been reviewed and is understood.  Full responsibility of the confidentiality of this discharge information lies with you and/or your care-partner.

## 2022-03-12 NOTE — Op Note (Signed)
[facility] Patient Name: Walter Mendoza Procedure Date: 03/12/2022 2:50 PM MRN: [id number] Endoscopist: Mauri Pole , MD Age: 31 Referring MD:  Date of Birth: 1990-09-02 Gender: Male Account #: [id number] Procedure:                Upper GI endoscopy Indications:              Persistent vomiting of unknown cause Medicines:                Propofol per Anesthesia Procedure:                Pre-Anesthesia Assessment:                           - Prior to the procedure, a History and Physical                            was performed, and patient medications and                            allergies were reviewed. The patient's tolerance of                            previous anesthesia was also reviewed. The risks                            and benefits of the procedure and the sedation                            options and risks were discussed with the patient.                            All questions were answered, and informed consent                            was obtained. Prior Anticoagulants: The patient has                            taken no previous anticoagulant or antiplatelet                            agents. ASA Grade Assessment: II - A patient with                            mild systemic disease. After reviewing the risks                            and benefits, the patient was deemed in                            satisfactory condition to undergo the procedure.                           After obtaining informed consent, the endoscope was  passed under direct vision. Throughout the                            procedure, the patient's blood pressure, pulse, and                            oxygen saturations were monitored continuously. The                            GIF [id number] was introduced through the                            mouth, and advanced to the second part of duodenum.                            The upper GI  endoscopy was accomplished without                            difficulty. The patient tolerated the procedure                            well. Scope In: Scope Out: Findings:                 The esophagus was normal.                           Patchy mild inflammation characterized by                            congestion (edema) was found in the entire examined                            stomach. Biopsies were taken with a cold forceps                            for Helicobacter pylori testing.                           The cardia and gastric fundus were normal on                            retroflexion.                           Patchy mildly erythematous mucosa without active                            bleeding and with no stigmata of bleeding was found                            in the duodenal bulb.                           Otherwise rest of the examined duodenum was normal. Complications:  No immediate complications. Estimated Blood Loss:     Estimated blood loss was minimal. Impression:               - Normal esophagus.                           - Gastritis. Biopsied.                           - Erythematous duodenopathy.                           - Normal examined duodenum. Recommendation:           - Patient has a contact number available for                            emergencies. The signs and symptoms of potential                            delayed complications were discussed with the                            patient. Return to normal activities tomorrow.                            Written discharge instructions were provided to the                            patient.                           - Resume previous diet.                           - Continue present medications.                           - Await pathology results. Mauri Pole, MD 03/12/2022 3:33:05 PM This report has been signed electronically.

## 2022-03-13 ENCOUNTER — Telehealth: Payer: Self-pay

## 2022-03-13 NOTE — Telephone Encounter (Signed)
  Follow up Call-     03/12/2022    1:56 PM  Call back number  Post procedure Call Back phone  # [phone]  Permission to leave phone message Yes     Left message

## 2022-03-29 ENCOUNTER — Other Ambulatory Visit: Payer: Self-pay

## 2022-03-29 MED ORDER — METRONIDAZOLE 250 MG PO TABS: 250.0000 mg | ORAL_TABLET | Freq: Four times a day (QID) | ORAL | 0 refills | Status: AC

## 2022-03-29 MED ORDER — DOXYCYCLINE HYCLATE 100 MG PO CAPS: 100.0000 mg | ORAL_CAPSULE | Freq: Two times a day (BID) | ORAL | 0 refills | Status: AC

## 2022-03-29 MED ORDER — OMEPRAZOLE 40 MG PO CPDR: 40.0000 mg | DELAYED_RELEASE_CAPSULE | Freq: Two times a day (BID) | ORAL | 0 refills | Status: DC

## 2022-04-11 ENCOUNTER — Ambulatory Visit (HOSPITAL_BASED_OUTPATIENT_CLINIC_OR_DEPARTMENT_OTHER): Payer: BC Managed Care – PPO | Admitting: Family Medicine

## 2022-04-19 ENCOUNTER — Other Ambulatory Visit: Payer: Self-pay

## 2022-04-19 DIAGNOSIS — A048 Other specified bacterial intestinal infections: Secondary | ICD-10-CM

## 2022-09-23 IMAGING — DX DG HAND COMPLETE 3+V*R*
3 series · 3 of 3 positions shown · non-contrast
Comparison: None Available.

CLINICAL DATA: Right hand pain and weakness. Swelling. No known
injury. Acute right wrist pain

EXAM:
RIGHT HAND - COMPLETE 3+ VIEW

[hand ap]
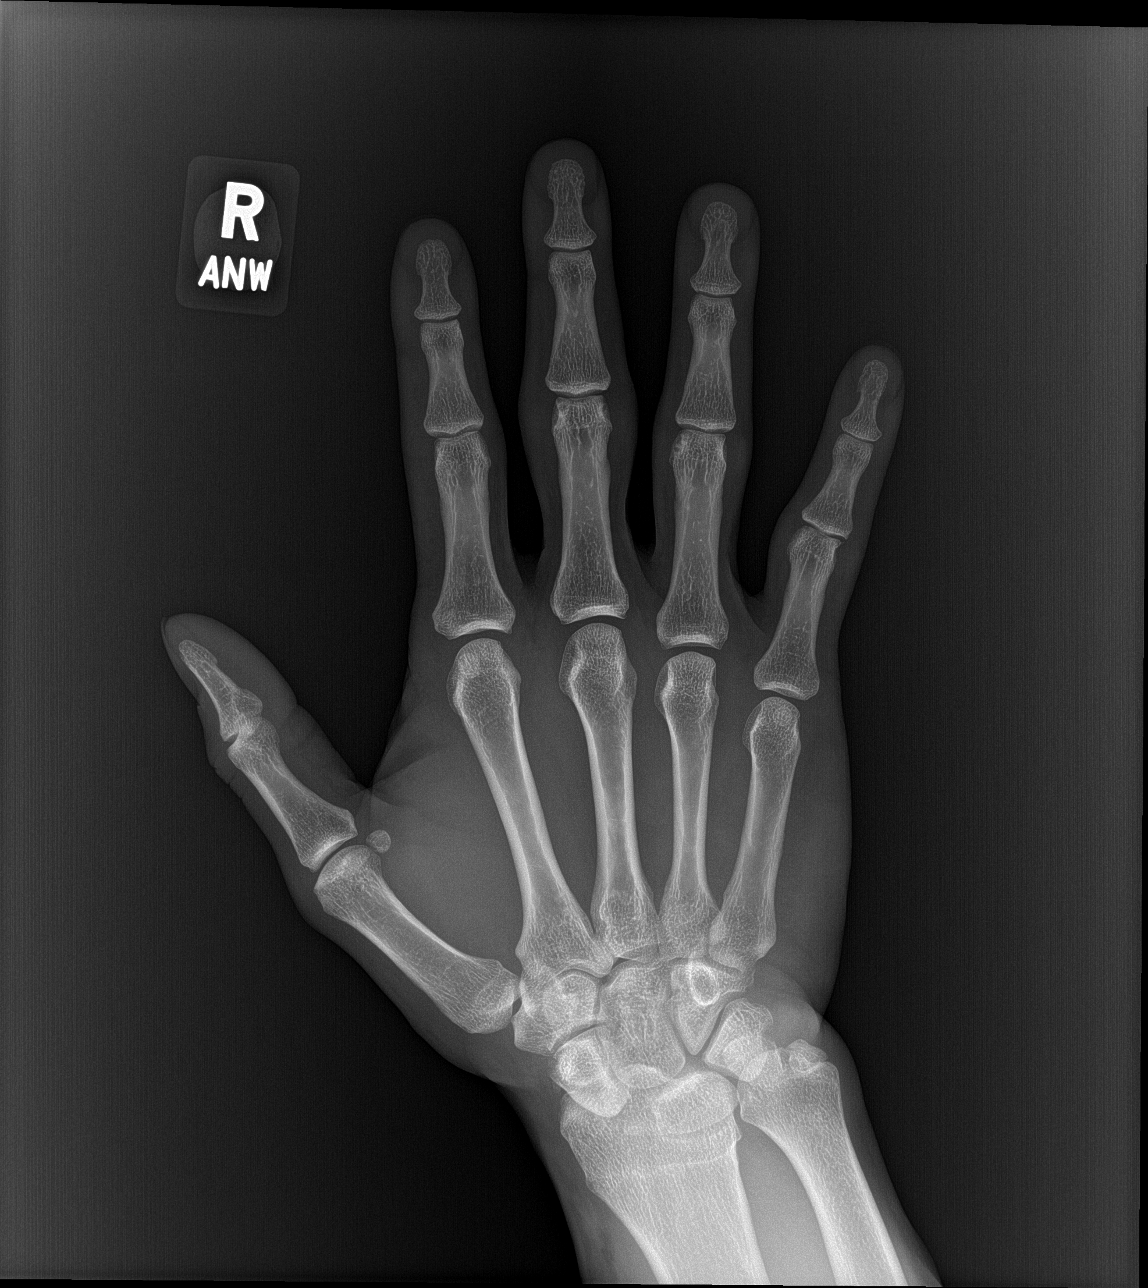

[hand obl]
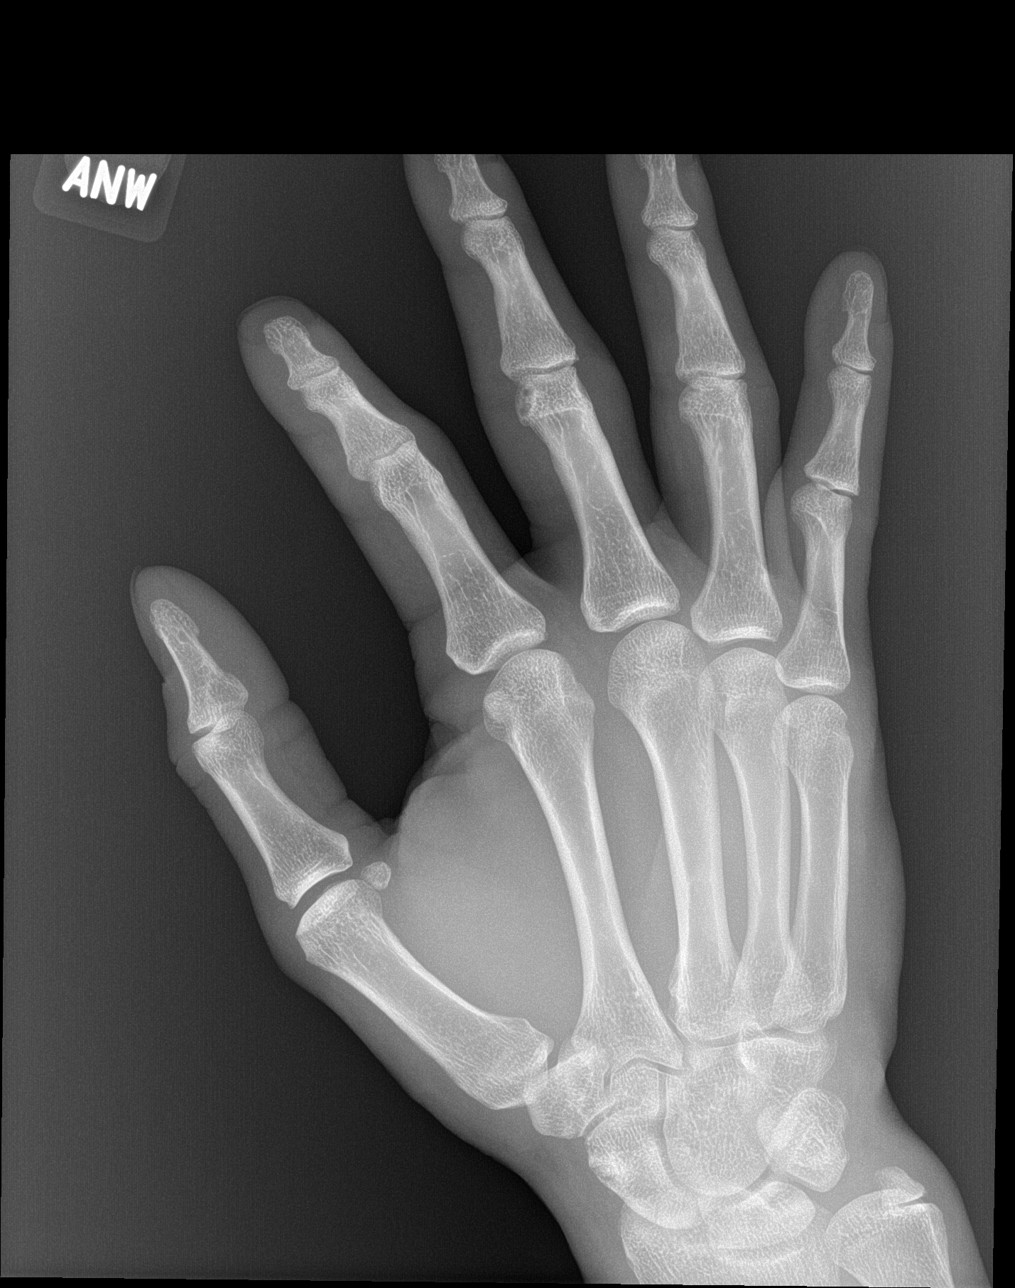

[hand lat]
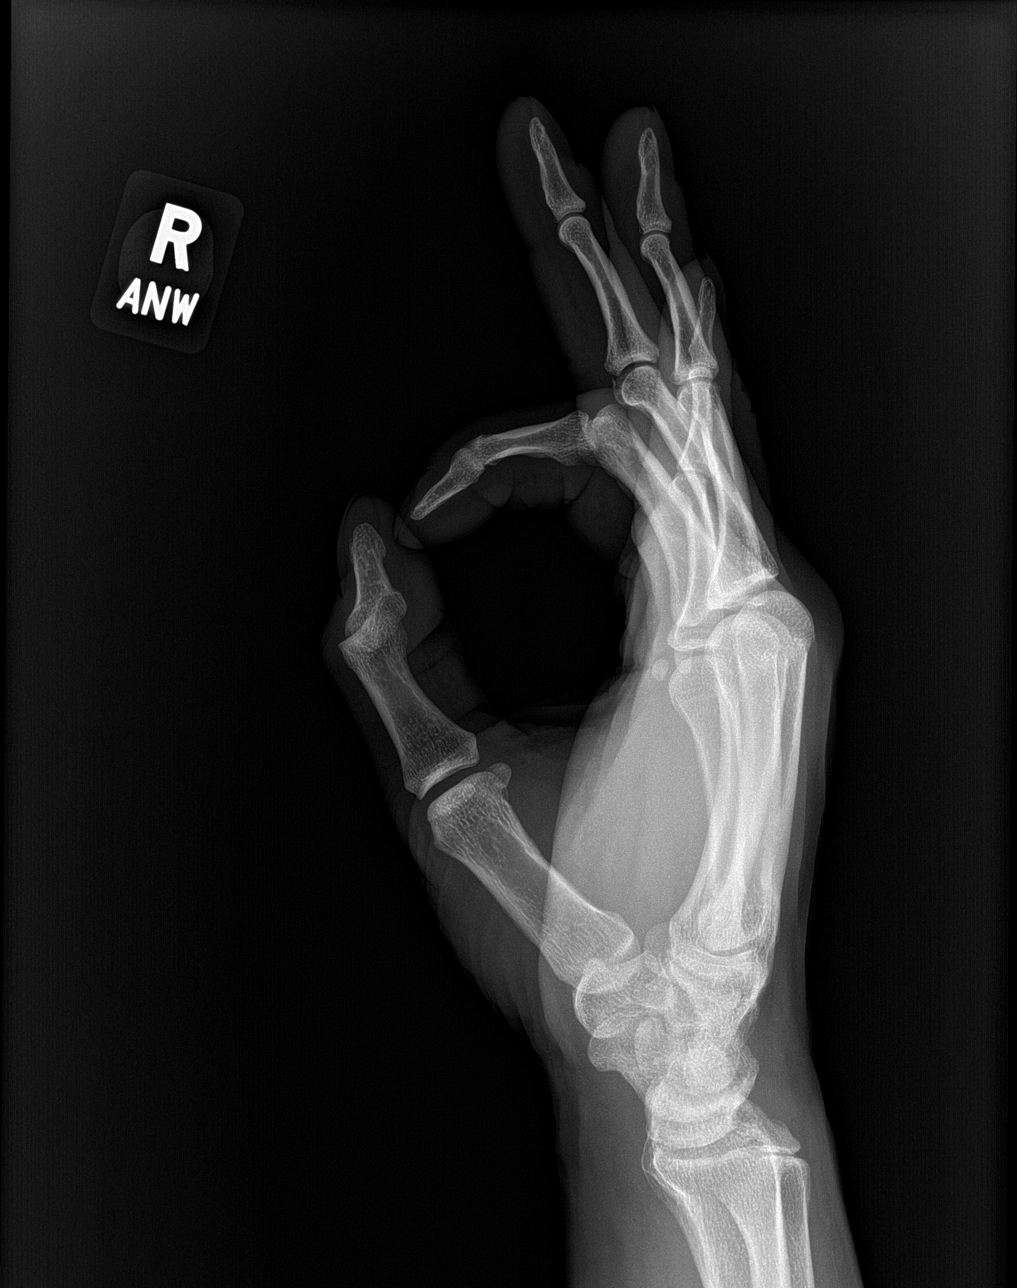

[3 of 3 positions shown; findings below may reference images not displayed]

FINDINGS: There is widening of the scapholunate interval at 7 mm, although
assessment is limited due to positioning. Hand alignment is
otherwise normal. The joint spaces are otherwise normal. Well
corticated density adjacent to the ulna styloid suggestive of remote
prior injury or accessory ossicle. No fracture. No erosion,
periostitis or focal bone abnormality. There is mild swelling about
the proximal interphalangeal joints, most prominently affecting the
third digit. No soft tissue calcifications.
IMPRESSION: 1. Soft tissue swelling about the proximal interphalangeal joints,
most prominently affecting the third digit. No underlying osseous
abnormality.
2. Widening of the scapholunate interval at 7 mm, suggestive of
ligamentous injury/degeneration.

## 2022-11-22 ENCOUNTER — Ambulatory Visit
Admission: RE | Admit: 2022-11-22 | Discharge: 2022-11-22 | Disposition: A | Discharge status: Home / Self Care | Payer: BC Managed Care – PPO | Source: Ambulatory Visit | Attending: Urgent Care | Admitting: Urgent Care

## 2022-11-22 VITALS — BP 126/75 | HR 57 | Temp 97.5°F | Resp 16

## 2022-11-22 DIAGNOSIS — G4452 New daily persistent headache (NDPH): Secondary | ICD-10-CM | POA: Diagnosis not present

## 2022-11-22 MED ORDER — DEXAMETHASONE SODIUM PHOSPHATE 10 MG/ML IJ SOLN
10.0000 mg | Freq: Once | INTRAMUSCULAR | Status: AC
Start: 2022-11-22 — End: 2022-11-22
  Administered 2022-11-22: 10 mg via INTRAMUSCULAR

## 2022-11-22 MED ORDER — RIZATRIPTAN BENZOATE 10 MG PO TBDP: 10.0000 mg | ORAL_TABLET | ORAL | 0 refills | Status: DC | PRN

## 2022-11-22 MED ORDER — KETOROLAC TROMETHAMINE 30 MG/ML IJ SOLN
30.0000 mg | Freq: Once | INTRAMUSCULAR | Status: AC
  Administered 2022-11-22: 30 mg via INTRAMUSCULAR

## 2022-11-22 NOTE — Discharge Instructions (Addendum)
You were given an injection of a steroid and an anti-inflammatory in our clinic. This should help resolve your headache. Do not take any additional anti-inflammatory medications such as Aleve, naproxen, Motrin, Advil today. If additional over-the-counter medication is needed, you may take 1000 mg of Tylenol up to 3 times daily.  Please rest and drink plenty of water. Should your pain continue, you may take the rizatriptan called in today. You will place 1 tablet under your tongue as needed.  It will dissolve under your tongue. You may repeat in 2 hours if needed.  You cannot exceed 2 tablets in 24 hours.  If your headache continues despite treatment, please return for additional workup.  Should you develop change in vision, stiffness of your neck, nausea or vomiting, or fever, please head to the emergency room immediately.

## 2022-11-22 NOTE — ED Provider Notes (Signed)
[facility]    CSN: [id number] Arrival date & time: 11/22/22  0854      History   Chief Complaint Chief Complaint  Patient presents with   Headache    Consistent headache 2x days with no relief - Entered by patient    HPI Walter Mendoza is a 32 y.o. male.   Pleasant 32 year old male presents to due to concerns of a headache.  He states it has been persistent for the past 2 days.  He denies a history of recurrent headaches in the past.  He denies a thunderclap headache.  His wife is present who also helps with the history.  She states it has not prevented him from still being physically active, however he works at the [employer] center and felt that he was unable to perform strenuous lifting activities due to the pain.  He reports the pain is located to his bilateral parietal region.  He tried ibuprofen 600 mg 1 time, 1000 mg of Tylenol a few times, and 1 tablet of his wife's Fioricet.  He felt these treatments were ineffective.  Patient denies nuchal rigidity, fever, photophobia.  Patient denies dizziness, slurred speech, weakness of an extremity, tinnitus, or vision changes.  He denies gait disturbance or feeling unsteady.  He denies lightheadedness.  He denies any motor vehicle accident, trauma, or injury prior to symptom onset.   Headache   Past Medical History:  Diagnosis Date   Kidney stones     Patient Active Problem List   Diagnosis Date Noted   Tinea corporis 11/24/2021   Wrist pain, acute, right 11/24/2021   Wellness examination 10/10/2021   Pulmonary nodule 10/10/2021   Small bowel lesion 10/10/2021   Nephrolithiasis 09/28/2021   Flank pain 08/22/2021   Elevated blood pressure reading in office without diagnosis of hypertension 07/12/2021   History of nephrolithiasis 07/12/2021   Bilateral knee pain 07/12/2021    History reviewed. No pertinent surgical history.     Home Medications    Prior to Admission medications   Medication Sig  Start Date End Date Taking? Authorizing Provider  rizatriptan (MAXALT-MLT) 10 MG disintegrating tablet Take 1 tablet (10 mg total) by mouth as needed for migraine. May repeat in 2 hours if needed 11/22/22  Yes Elliott Quade L, PA  ondansetron (ZOFRAN-ODT) 8 MG disintegrating tablet Take 1 tablet (8 mg total) by mouth every 8 (eight) hours as needed for nausea or vomiting. Patient not taking: Reported on 03/12/2022 12/02/21   Wallis Bamberg, PA-C    Family History Family History  Problem Relation Age of Onset   Hypertension Mother    Blindness Father     Social History Social History   Tobacco Use   Smoking status: Never   Smokeless tobacco: Never  Vaping Use   Vaping Use: Never used  Substance Use Topics   Alcohol use: Yes    Alcohol/week: 1.0 - 2.0 standard drink of alcohol    Types: 1 - 2 Cans of beer per week   Drug use: Never     Allergies   Bee venom and Penicillins   Review of Systems Review of Systems  Neurological:  Positive for headaches.  As per HPI   Physical Exam Triage Vital Signs ED Triage Vitals  Enc Vitals Group     BP 11/22/22 0901 126/75     Pulse Rate 11/22/22 0901 (!) 57     Resp 11/22/22 0901 16     Temp 11/22/22 0901 (!) 97.5 F (36.4  C)     Temp src --      SpO2 11/22/22 0901 98 %     Weight --      Height --      Head Circumference --      Peak Flow --      Pain Score 11/22/22 0900 8     Pain Loc --      Pain Edu? --      Excl. in GC? --    No data found.  Updated Vital Signs BP 126/75   Pulse (!) 57   Temp (!) 97.5 F (36.4 C)   Resp 16   SpO2 98%   Visual Acuity Right Eye Distance:   Left Eye Distance:   Bilateral Distance:    Right Eye Near:   Left Eye Near:    Bilateral Near:     Physical Exam Vitals and nursing note reviewed. Exam conducted with a chaperone present.  Constitutional:      General: He is not in acute distress.    Appearance: Normal appearance. He is normal weight. He is not ill-appearing,  toxic-appearing or diaphoretic.  HENT:     Head: Normocephalic and atraumatic.     Right Ear: Tympanic membrane, ear canal and external ear normal. There is no impacted cerumen.     Left Ear: Tympanic membrane, ear canal and external ear normal. There is no impacted cerumen.     Nose: Nose normal. No congestion or rhinorrhea.     Mouth/Throat:     Mouth: Mucous membranes are moist.     Pharynx: Oropharynx is clear. No oropharyngeal exudate or posterior oropharyngeal erythema.  Eyes:     General: No visual field deficit or scleral icterus.       Right eye: No discharge.        Left eye: No discharge.     Extraocular Movements: Extraocular movements intact.     Right eye: Normal extraocular motion.     Left eye: Normal extraocular motion.     Conjunctiva/sclera: Conjunctivae normal.     Pupils: Pupils are equal, round, and reactive to light. Pupils are equal.     Right eye: Pupil is round and reactive.     Left eye: Pupil is round and reactive.  Neck:     Meningeal: Brudzinski's sign absent.  Cardiovascular:     Rate and Rhythm: Normal rate and regular rhythm.     Pulses: Normal pulses.     Heart sounds: Normal heart sounds. No murmur heard.    No friction rub. No gallop.  Pulmonary:     Effort: Pulmonary effort is normal. No respiratory distress.     Breath sounds: Normal breath sounds. No stridor. No wheezing, rhonchi or rales.  Chest:     Chest wall: No tenderness.  Abdominal:     Palpations: Abdomen is soft.  Musculoskeletal:        General: No swelling, tenderness, deformity or signs of injury. Normal range of motion.     Cervical back: Normal range of motion and neck supple. No rigidity or tenderness.     Right lower leg: No edema.     Left lower leg: No edema.  Lymphadenopathy:     Cervical: No cervical adenopathy.  Skin:    General: Skin is warm.     Capillary Refill: Capillary refill takes less than 2 seconds.     Coloration: Skin is not cyanotic, jaundiced or pale.      Findings: No bruising,  erythema, lesion or rash.  Neurological:     General: No focal deficit present.     Mental Status: He is alert and oriented to person, place, and time. Mental status is at baseline.     Cranial Nerves: Cranial nerves 2-12 are intact. No cranial nerve deficit, dysarthria or facial asymmetry.     Sensory: Sensation is intact. No sensory deficit.     Motor: Motor function is intact. No weakness, tremor, atrophy, abnormal muscle tone or pronator drift.     Coordination: Coordination is intact. Romberg sign negative. Coordination normal. Finger-Nose-Finger Test normal.     Gait: Gait normal.     Deep Tendon Reflexes: Reflexes are normal and symmetric.  Psychiatric:        Mood and Affect: Mood normal.        Speech: Speech normal.        Behavior: Behavior normal.        Thought Content: Thought content normal.        Judgment: Judgment normal.      UC Treatments / Results  Labs (all labs ordered are listed, but only abnormal results are displayed) Labs Reviewed - No data to display  EKG   Radiology No results found.  Procedures Procedures (including critical care time)  Medications Ordered in UC Medications  ketorolac (TORADOL) 30 MG/ML injection 30 mg (30 mg Intramuscular Given 11/22/22 0921)  dexamethasone (DECADRON) injection 10 mg (10 mg Intramuscular Given 11/22/22 0921)    Initial Impression / Assessment and Plan / UC Course  I have reviewed the triage vital signs and the nursing notes.  Pertinent labs & imaging results that were available during my care of the patient were reviewed by me and considered in my medical decision making (see chart for details).     Persistent headache - VSS, normal neuro exam in office. Pt denying red flag s/sx. He was given toradol and decadron in office, monitored for 20 minutes in which pt stated he was starting to develop symptom relief. Will DC patient home with rizatriptan in case sx persist. Strict RTC and  ER precautions reviewed with pt and wife who stated understanding. Work note provided.   Final Clinical Impressions(s) / UC Diagnoses   Final diagnoses:  New daily persistent headache     Discharge Instructions      You were given an injection of a steroid and an anti-inflammatory in our clinic. This should help resolve your headache. Do not take any additional anti-inflammatory medications such as Aleve, naproxen, Motrin, Advil today. If additional over-the-counter medication is needed, you may take 1000 mg of Tylenol up to 3 times daily.  Please rest and drink plenty of water. Should your pain continue, you may take the rizatriptan called in today. You will place 1 tablet under your tongue as needed.  It will dissolve under your tongue. You may repeat in 2 hours if needed.  You cannot exceed 2 tablets in 24 hours.  If your headache continues despite treatment, please return for additional workup.  Should you develop change in vision, stiffness of your neck, nausea or vomiting, or fever, please head to the emergency room immediately.     ED Prescriptions     Medication Sig Dispense Auth. Provider   rizatriptan (MAXALT-MLT) 10 MG disintegrating tablet Take 1 tablet (10 mg total) by mouth as needed for migraine. May repeat in 2 hours if needed 10 tablet Dillard, Vin[place] L, [place]      PDMP not reviewed this  encounter.   Maretta Bees, [place] 11/22/22 1109

## 2022-11-22 NOTE — ED Triage Notes (Signed)
Pt presents to uc with co of headache for 2 days with no symptoms improvement with motrin and tylenol. Pt reports he uses his so medication for ha with no improvement.

## 2023-04-17 ENCOUNTER — Other Ambulatory Visit (HOSPITAL_COMMUNITY): Payer: Self-pay

## 2023-04-17 MED ORDER — INFLUENZA VIRUS VACC SPLIT PF (FLUZONE) 0.5 ML IM SUSY
0.5000 mL | PREFILLED_SYRINGE | Freq: Once | INTRAMUSCULAR | 0 refills | Status: AC
  Filled 2023-04-17: qty 0.5, 1d supply, fill #0

## 2023-06-10 ENCOUNTER — Other Ambulatory Visit (HOSPITAL_BASED_OUTPATIENT_CLINIC_OR_DEPARTMENT_OTHER): Payer: Self-pay

## 2023-06-10 ENCOUNTER — Telehealth: Payer: BC Managed Care – PPO | Admitting: Physician Assistant

## 2023-06-10 ENCOUNTER — Telehealth: Payer: BC Managed Care – PPO | Admitting: Family Medicine

## 2023-06-10 DIAGNOSIS — K047 Periapical abscess without sinus: Secondary | ICD-10-CM | POA: Diagnosis not present

## 2023-06-10 MED ORDER — CLINDAMYCIN HCL 300 MG PO CAPS
300.0000 mg | ORAL_CAPSULE | Freq: Three times a day (TID) | ORAL | 0 refills | Status: DC
  Filled 2023-06-10: qty 30, 10d supply, fill #0

## 2023-06-10 NOTE — Patient Instructions (Signed)
Walter Mendoza, thank you for joining Margaretann Loveless, PA-C for today's virtual visit.  While this provider is not your primary care provider (PCP), if your PCP is located in our provider database this encounter information will be shared with them immediately following your visit.   A [facility] MyChart account gives you access to today's visit and all your visits, tests, and labs performed at [facility] " click here if you don't have a [facility] MyChart account or go to [email or web address]  Consent: (Patient) Walter Mendoza provided verbal consent for this virtual visit at the beginning of the encounter.  Current Medications:  Current Outpatient Medications:    clindamycin (CLEOCIN) 300 MG capsule, Take 1 capsule (300 mg total) by mouth 3 (three) times daily., Disp: 30 capsule, Rfl: 0   ondansetron (ZOFRAN-ODT) 8 MG disintegrating tablet, Take 1 tablet (8 mg total) by mouth every 8 (eight) hours as needed for nausea or vomiting. (Patient not taking: Reported on 03/12/2022), Disp: 20 tablet, Rfl: 0   rizatriptan (MAXALT-MLT) 10 MG disintegrating tablet, Take 1 tablet (10 mg total) by mouth as needed for migraine. May repeat in 2 hours if needed, Disp: 10 tablet, Rfl: 0   Medications ordered in this encounter:  Meds ordered this encounter  Medications   clindamycin (CLEOCIN) 300 MG capsule    Sig: Take 1 capsule (300 mg total) by mouth 3 (three) times daily.    Dispense:  30 capsule    Refill:  0    Supervising Provider:   Merrilee Jansky [[id number]]     *If you need refills on other medications prior to your next appointment, please contact your pharmacy*  Follow-Up: Call back or seek an in-person evaluation if the symptoms worsen or if the condition fails to improve as anticipated.  [facility] [phone]  Other Instructions Dental Abscess  A dental abscess is an infection around a tooth that may involve pain, swelling, and a collection of  pus, as well as other symptoms. Treatment is important to help with symptoms and to prevent the infection from spreading. The general types of dental abscesses are: Pulpal abscess. This abscess may form from the inner part of the tooth (pulp). Periodontal abscess. This abscess may form from the gum. What are the causes? This condition is caused by a bacterial infection in or around the tooth. It may result from: Severe tooth decay (cavities). Trauma to the tooth, such as a broken or chipped tooth. What increases the risk? This condition is more likely to develop in males. It is also more likely to develop in people who: Have cavities. Have severe gum disease. Eat sugary snacks between meals. Use tobacco products. Have diabetes. Have a weakened disease-fighting system (immune system). Do not brush and care for their teeth regularly. What are the signs or symptoms? Mild symptoms of this condition include: Tenderness. Bad breath. Fever. A bitter taste in the mouth. Pain in and around the infected tooth. Moderate symptoms of this condition include: Swollen neck glands. Chills. Pus drainage. Swelling and redness around the infected tooth, in the mouth, or in the face. Severe pain in and around the infected tooth. Severe symptoms of this condition include: Difficulty swallowing. Difficulty opening the mouth. Nausea. Vomiting. How is this diagnosed? This condition is diagnosed based on: Your symptoms and your medical and dental history. An examination of the infected tooth. During the exam, your dental care provider may tap on the infected tooth. You may also need to  have X-rays taken of the affected area. How is this treated? This condition is treated by getting rid of the infection. This may be done with: Antibiotic medicines. These may be used in certain situations. Antibacterial mouth rinse. Incision and drainage. This procedure is done by making an incision in the abscess to  drain out the pus. Removing pus is the first priority in treating an abscess. A root canal. This may be performed to save the tooth. Your dental care provider accesses the visible part of your tooth (crown) with a drill and removes any infected pulp. Then the space is filled and sealed off. Tooth extraction. The tooth is pulled out if it cannot be saved by other treatment. You may also receive treatment for pain, such as: Acetaminophen or NSAIDs. Gels that contain a numbing medicine. An injection to block the pain near your nerve. Follow these instructions at home: Medicines Take over-the-counter and prescription medicines only as told by your dental care provider. If you were prescribed an antibiotic, take it as told by your dental care provider. Do not stop taking the antibiotic even if you start to feel better. If you were prescribed a gel that contains a numbing medicine, use it exactly as told in the directions. Do not use these gels for children who are younger than 42 years of age. Use an antibacterial mouth rinse as told by your dental care provider. General instructions  Gargle with a mixture of salt and water 3-4 times a day or as needed. To make salt water, completely dissolve -1 tsp (3-6 g) of salt in 1 cup (237 mL) of warm water. Eat a soft diet while your abscess is healing. Drink enough fluid to keep your urine pale yellow. Do not apply heat to the outside of your mouth. Do not use any products that contain nicotine or tobacco. These products include cigarettes, chewing tobacco, and vaping devices, such as e-cigarettes. If you need help quitting, ask your dental care provider. Keep all follow-up visits. This is important. How is this prevented?  Excellent dental home care, which includes brushing your teeth every morning and night with fluoride toothpaste. Floss one time each day. Get regularly scheduled dental cleanings. Consider having a dental sealant applied on teeth that  have deep grooves to prevent cavities. Drink fluoridated water regularly. This includes most tap water. Check the label on bottled water to see if it contains fluoride. Reduce or eliminate sugary drinks. Eat healthy meals and snacks. Wear a mouth guard or face shield to protect your teeth while playing sports. Contact a health care provider if: Your pain is worse and is not helped by medicine. You have swelling. You see pus around the tooth. You have a fever or chills. Get help right away if: Your symptoms suddenly get worse. You have a very bad headache. You have problems breathing or swallowing. You have trouble opening your mouth. You have swelling in your neck or around your eye. These symptoms may represent a serious problem that is an emergency. Do not wait to see if the symptoms will go away. Get medical help right away. Call your local emergency services (911 in the [place] Do not drive yourself to the hospital. Summary A dental abscess is a collection of pus in or around a tooth that results from an infection. A dental abscess may result from severe tooth decay, trauma to the tooth, or severe gum disease around a tooth. Symptoms include severe pain, swelling, redness, and drainage of pus  in and around the infected tooth. The first priority in treating a dental abscess is to drain out the pus. Treatment may also involve removing damage inside the tooth (root canal) or extracting the tooth. This information is not intended to replace advice given to you by your health care provider. Make sure you discuss any questions you have with your health care provider. Document Revised: 08/18/2020 Document Reviewed: 08/18/2020 Elsevier Patient Education  2024 Elsevier Inc.    If you have been instructed to have an in-person evaluation today at a local Urgent Care facility, please use the link below. It will take you to a list of all of our available [facility], including  address, phone number and hours of operation. Please do not delay care.  [facility]  If you or a family member do not have a primary care provider, use the link below to schedule a visit and establish care. When you choose a [place] primary care physician or advanced practice provider, you gain a long-term partner in health. Find a Primary Care Provider  Learn more about [facility]'s in-office and virtual care options:  - Get Care Now

## 2023-06-10 NOTE — Progress Notes (Signed)
Virtual Visit Consent   Walter Mendoza, you are scheduled for a virtual visit with a [facility] provider today. Just as with appointments in the office, your consent must be obtained to participate. Your consent will be active for this visit and any virtual visit you may have with one of our providers in the next 365 days. If you have a MyChart account, a copy of this consent can be sent to you electronically.  As this is a virtual visit, video technology does not allow for your provider to perform a traditional examination. This may limit your provider's ability to fully assess your condition. If your provider identifies any concerns that need to be evaluated in person or the need to arrange testing (such as labs, EKG, etc.), we will make arrangements to do so. Although advances in technology are sophisticated, we cannot ensure that it will always work on either your end or our end. If the connection with a video visit is poor, the visit may have to be switched to a telephone visit. With either a video or telephone visit, we are not always able to ensure that we have a secure connection.  By engaging in this virtual visit, you consent to the provision of healthcare and authorize for your insurance to be billed (if applicable) for the services provided during this visit. Depending on your insurance coverage, you may receive a charge related to this service.  I need to obtain your verbal consent now. Are you willing to proceed with your visit today? Walter Mendoza has provided verbal consent on 06/10/2023 for a virtual visit (video or telephone). Margaretann Loveless, PA-C  Date: 06/10/2023 8:53 AM  Virtual Visit via Video Note   I, Margaretann Loveless, connected with  Walter Mendoza  ([id number], [birth year]) on 06/10/23 at  8:45 AM EST by a video-enabled telemedicine application and verified that I am speaking with the correct person using two identifiers.  Location: Patient: Virtual Visit Location Patient:  Home Provider: Virtual Visit Location Provider: Home Office   I discussed the limitations of evaluation and management by telemedicine and the availability of in person appointments. The patient expressed understanding and agreed to proceed.    History of Present Illness: Walter Mendoza is a 32 y.o. who identifies as a male who was assigned male at birth, and is being seen today for dental pain.  HPI: Dental Pain  This is a new problem. The current episode started in the past 7 days (2 days ago). The problem occurs constantly. The problem has been gradually worsening. The pain is severe. Associated symptoms include facial pain (right) and thermal sensitivity. Pertinent negatives include no difficulty swallowing, fever, oral bleeding or sinus pressure. He has tried NSAIDs and acetaminophen (mouthwash) for the symptoms. The treatment provided no relief.    Has dental appt scheduled on Jan 2nd.  Problems:  Patient Active Problem List   Diagnosis Date Noted   Tinea corporis 11/24/2021   Wrist pain, acute, right 11/24/2021   Wellness examination 10/10/2021   Pulmonary nodule 10/10/2021   Small bowel lesion 10/10/2021   Nephrolithiasis 09/28/2021   Flank pain 08/22/2021   Elevated blood pressure reading in office without diagnosis of hypertension 07/12/2021   History of nephrolithiasis 07/12/2021   Bilateral knee pain 07/12/2021    Allergies:  Allergies  Allergen Reactions   Bee Venom Anaphylaxis   Penicillins Hives   Medications:  Current Outpatient Medications:    clindamycin (CLEOCIN) 300 MG capsule, Take 1 capsule (300 mg  total) by mouth 3 (three) times daily., Disp: 30 capsule, Rfl: 0   ondansetron (ZOFRAN-ODT) 8 MG disintegrating tablet, Take 1 tablet (8 mg total) by mouth every 8 (eight) hours as needed for nausea or vomiting. (Patient not taking: Reported on 03/12/2022), Disp: 20 tablet, Rfl: 0   rizatriptan (MAXALT-MLT) 10 MG disintegrating tablet, Take 1 tablet (10 mg total) by  mouth as needed for migraine. May repeat in 2 hours if needed, Disp: 10 tablet, Rfl: 0  Observations/Objective: Patient is well-developed, well-nourished in no acute distress.  Resting comfortably at home.  Head is normocephalic, atraumatic.  No labored breathing.  Speech is clear and coherent with logical content.  Patient is alert and oriented at baseline.    Assessment and Plan: 1. Dental abscess (Primary) - clindamycin (CLEOCIN) 300 MG capsule; Take 1 capsule (300 mg total) by mouth 3 (three) times daily.  Dispense: 30 capsule; Refill: 0  - Suspected infection  - Clindamycin prescribed - Can use ice on outside jaw/cheek for swelling - Can also take Ibuprofen and tylenol for pain with other medications - Discussed Clove paste  - Keep scheduled appt on Jan 2nd - Seek in person evaluation if symptoms fail to improve or if they worsen   Follow Up Instructions: I discussed the assessment and treatment plan with the patient. The patient was provided an opportunity to ask questions and all were answered. The patient agreed with the plan and demonstrated an understanding of the instructions.  A copy of instructions were sent to the patient via MyChart unless otherwise noted below.    The patient was advised to call back or seek an in-person evaluation if the symptoms worsen or if the condition fails to improve as anticipated.    Margaretann Loveless, PA-C

## 2023-06-11 ENCOUNTER — Telehealth: Payer: BC Managed Care – PPO | Admitting: Physician Assistant

## 2023-06-11 DIAGNOSIS — K047 Periapical abscess without sinus: Secondary | ICD-10-CM

## 2023-06-11 NOTE — Progress Notes (Signed)
Virtual Visit Consent   Walter Mendoza, you are scheduled for a virtual visit with a [facility] provider today. Just as with appointments in the office, your consent must be obtained to participate. Your consent will be active for this visit and any virtual visit you may have with one of our providers in the next 365 days. If you have a MyChart account, a copy of this consent can be sent to you electronically.  As this is a virtual visit, video technology does not allow for your provider to perform a traditional examination. This may limit your provider's ability to fully assess your condition. If your provider identifies any concerns that need to be evaluated in person or the need to arrange testing (such as labs, EKG, etc.), we will make arrangements to do so. Although advances in technology are sophisticated, we cannot ensure that it will always work on either your end or our end. If the connection with a video visit is poor, the visit may have to be switched to a telephone visit. With either a video or telephone visit, we are not always able to ensure that we have a secure connection.  By engaging in this virtual visit, you consent to the provision of healthcare and authorize for your insurance to be billed (if applicable) for the services provided during this visit. Depending on your insurance coverage, you may receive a charge related to this service.  I need to obtain your verbal consent now. Are you willing to proceed with your visit today? Walter Mendoza has provided verbal consent on 06/11/2023 for a virtual visit (video or telephone). Piedad Climes, [place]  Date: 06/11/2023 9:31 AM  Virtual Visit via Video Note   I, Piedad Climes, connected with  Walter Mendoza  ([id number], 1990/07/17) on 06/11/23 at  9:15 AM EST by a video-enabled telemedicine application and verified that I am speaking with the correct person using two identifiers.  Location: Patient: Virtual Visit Location Patient:  Home Provider: Virtual Visit Location Provider: Home Office   I discussed the limitations of evaluation and management by telemedicine and the availability of in person appointments. The patient expressed understanding and agreed to proceed.    History of Present Illness: Walter Mendoza is a 32 y.o. who identifies as a male who was assigned male at birth, and is being seen today for some increased R sided facial swelling since visit yesterday for suspected dental abscess. Notes he woke up with some puffiness over R cheek this morning. Denies increased pain but some noted tightness. Was prescribed Clindamycin yesterday (allergic to penicillins) and has gotten a couple of doses in his system so far. Notes tolerating medication well and some decrease in pain. Denies fever, chills. Denies any SOB. Has a dental provider but has not reached out to them.  HPI: HPI  Problems:  Patient Active Problem List   Diagnosis Date Noted   Tinea corporis 11/24/2021   Wrist pain, acute, right 11/24/2021   Wellness examination 10/10/2021   Pulmonary nodule 10/10/2021   Small bowel lesion 10/10/2021   Nephrolithiasis 09/28/2021   Flank pain 08/22/2021   Elevated blood pressure reading in office without diagnosis of hypertension 07/12/2021   History of nephrolithiasis 07/12/2021   Bilateral knee pain 07/12/2021    Allergies:  Allergies  Allergen Reactions   Bee Venom Anaphylaxis   Penicillins Hives   Medications:  Current Outpatient Medications:    clindamycin (CLEOCIN) 300 MG capsule, Take 1 capsule (300 mg total) by mouth 3 (  three) times daily., Disp: 30 capsule, Rfl: 0   ondansetron (ZOFRAN-ODT) 8 MG disintegrating tablet, Take 1 tablet (8 mg total) by mouth every 8 (eight) hours as needed for nausea or vomiting. (Patient not taking: Reported on 03/12/2022), Disp: 20 tablet, Rfl: 0   rizatriptan (MAXALT-MLT) 10 MG disintegrating tablet, Take 1 tablet (10 mg total) by mouth as needed for migraine. May repeat  in 2 hours if needed, Disp: 10 tablet, Rfl: 0  Observations/Objective: Patient is well-developed, well-nourished in no acute distress.  Resting comfortably  at home.  Head is normocephalic, atraumatic.  No labored breathing.  Speech is clear and coherent with logical content.  Patient is alert and oriented at baseline.  Mild right-sided facial fullness without erythema or bruising. This is mainly over the R lower jaw and buccal area.   Assessment and Plan: 1. Dental abscess (Primary)  Suspected mild swelling is a slight progression of the infection. Afebrile. Normal ROM of jaw. Suspect he has not been on antibiotic long enough for it to halt infection, let alone time for improvement. Discussed supportive measures and OTC medications. Cold compresses. Continue Clindamycin. Want he and wife to call dental provider to make them aware in case they can work him in today or in the morning. Discussed if any progressing symptoms after the next few doses of antibiotic, he needs an in person evaluation ASAP.  Follow Up Instructions: I discussed the assessment and treatment plan with the patient. The patient was provided an opportunity to ask questions and all were answered. The patient agreed with the plan and demonstrated an understanding of the instructions.  A copy of instructions were sent to the patient via MyChart unless otherwise noted below.   The patient was advised to call back or seek an in-person evaluation if the symptoms worsen or if the condition fails to improve as anticipated.    Piedad Climes, PA-C

## 2023-06-11 NOTE — Patient Instructions (Signed)
  Walter Mendoza, thank you for joining Piedad Climes, PA-C for today's virtual visit.  While this provider is not your primary care provider (PCP), if your PCP is located in our provider database this encounter information will be shared with them immediately following your visit.   A [facility] MyChart account gives you access to today's visit and all your visits, tests, and labs performed at [facility] " click here if you don't have a [facility] MyChart account or go to [email or web address]  Consent: (Patient) Walter Mendoza provided verbal consent for this virtual visit at the beginning of the encounter.  Current Medications:  Current Outpatient Medications:    clindamycin (CLEOCIN) 300 MG capsule, Take 1 capsule (300 mg total) by mouth 3 (three) times daily., Disp: 30 capsule, Rfl: 0   ondansetron (ZOFRAN-ODT) 8 MG disintegrating tablet, Take 1 tablet (8 mg total) by mouth every 8 (eight) hours as needed for nausea or vomiting. (Patient not taking: Reported on 03/12/2022), Disp: 20 tablet, Rfl: 0   rizatriptan (MAXALT-MLT) 10 MG disintegrating tablet, Take 1 tablet (10 mg total) by mouth as needed for migraine. May repeat in 2 hours if needed, Disp: 10 tablet, Rfl: 0   Medications ordered in this encounter:  No orders of the defined types were placed in this encounter.    *If you need refills on other medications prior to your next appointment, please contact your pharmacy*  Follow-Up: Call back or seek an in-person evaluation if the symptoms worsen or if the condition fails to improve as anticipated.  [facility] [phone]  Other Instructions Please keep hydrated. Continue the Clindamycin. OTC Tylenol and Ibuprofen as needed. Start cold compresses as discussed. I also recommend an OTC antihistamine to help reduce some of the inflammation -- Claritin or Xyzal are good options. Please call your dentist as discussed, to make them aware of current  symptoms. If symptoms are not turning the corner over the next 24 hours or you note any continued progression of symptoms, please seek in-person evaluation ASAP.   If you have been instructed to have an in-person evaluation today at a local Urgent Care facility, please use the link below. It will take you to a list of all of our available [place] [facility], including address, phone number and hours of operation. Please do not delay care.  [facility]  If you or a family member do not have a primary care provider, use the link below to schedule a visit and establish care. When you choose a [facility] primary care physician or advanced practice provider, you gain a long-term partner in health. Find a Primary Care Provider  Learn more about [place]'s in-office and virtual care options: [facility] - Get Care Now

## 2023-08-24 ENCOUNTER — Encounter

## 2023-09-06 ENCOUNTER — Other Ambulatory Visit: Payer: Self-pay

## 2023-09-06 ENCOUNTER — Ambulatory Visit
Admission: RE | Admit: 2023-09-06 | Discharge: 2023-09-06 | Disposition: A | Discharge status: Home / Self Care | Source: Ambulatory Visit | Attending: Family Medicine | Admitting: Family Medicine

## 2023-09-06 ENCOUNTER — Other Ambulatory Visit (HOSPITAL_BASED_OUTPATIENT_CLINIC_OR_DEPARTMENT_OTHER): Payer: Self-pay

## 2023-09-06 VITALS — BP 122/75 | HR 61 | Temp 97.7°F | Resp 16

## 2023-09-06 DIAGNOSIS — K529 Noninfective gastroenteritis and colitis, unspecified: Secondary | ICD-10-CM

## 2023-09-06 DIAGNOSIS — G44209 Tension-type headache, unspecified, not intractable: Secondary | ICD-10-CM

## 2023-09-06 LAB — POCT INFLUENZA A/B
Influenza A, POC: NEGATIVE
Influenza B, POC: NEGATIVE

## 2023-09-06 MED ORDER — ONDANSETRON HCL 4 MG PO TABS
4.0000 mg | ORAL_TABLET | Freq: Three times a day (TID) | ORAL | 0 refills | Status: AC | PRN
  Filled 2023-09-06: qty 20, 7d supply, fill #0

## 2023-09-06 MED ORDER — DEXAMETHASONE SODIUM PHOSPHATE 10 MG/ML IJ SOLN
10.0000 mg | Freq: Once | INTRAMUSCULAR | Status: AC
  Administered 2023-09-06: 10 mg via INTRAMUSCULAR

## 2023-09-06 MED ORDER — DIPHENOXYLATE-ATROPINE 2.5-0.025 MG PO TABS
1.0000 | ORAL_TABLET | Freq: Four times a day (QID) | ORAL | 0 refills | Status: DC | PRN
  Filled 2023-09-06: qty 30, 8d supply, fill #0

## 2023-09-06 MED ORDER — KETOROLAC TROMETHAMINE 30 MG/ML IJ SOLN
30.0000 mg | Freq: Once | INTRAMUSCULAR | Status: AC
  Administered 2023-09-06: 30 mg via INTRAMUSCULAR

## 2023-09-06 NOTE — ED Triage Notes (Addendum)
 Since Wednesday, has had vomiting, diarrhea, muscle aches, headache. Has had antidiarrheal medicine, tylenol/aspirin/caffeine medicine, regular tylenol. Last week had food poisoning.

## 2023-09-06 NOTE — ED Provider Notes (Signed)
 [facility]    CSN: [id number] Arrival date & time: 09/06/23  0850      History   Chief Complaint Chief Complaint  Patient presents with   Emesis   Diarrhea    HPI Walter Mendoza is a 33 y.o. male.  Patient presents today with a 3-day history of GI symptoms including vomiting, diarrhea, generalized bodyaches and headache.  He has been taken over-the-counter antidiarrheal medications along with medication for treatment of headache with minimal improvement of symptoms.  Patient is afebrile and has remained afebrile.  He reports he had food poisoning a week ago, which those symptoms had resolved prior to the onset of current symptoms.   Past Medical History:  Diagnosis Date   Kidney stones     Patient Active Problem List   Diagnosis Date Noted   Tinea corporis 11/24/2021   Wrist pain, acute, right 11/24/2021   Wellness examination 10/10/2021   Pulmonary nodule 10/10/2021   Small bowel lesion 10/10/2021   Nephrolithiasis 09/28/2021   Flank pain 08/22/2021   Elevated blood pressure reading in office without diagnosis of hypertension 07/12/2021   History of nephrolithiasis 07/12/2021   Bilateral knee pain 07/12/2021    History reviewed. No pertinent surgical history.     Home Medications    Prior to Admission medications   Medication Sig Start Date End Date Taking? Authorizing Provider  diphenoxylate-atropine (LOMOTIL) 2.5-0.025 MG tablet Take 1 tablet by mouth every 6 (six) hours as needed for diarrhea or loose stools. 09/06/23  Yes Bing Neighbors, NP  ondansetron (ZOFRAN) 4 MG tablet Take 1 tablet (4 mg total) by mouth every 8 (eight) hours as needed for nausea or vomiting. 09/06/23  Yes Bing Neighbors, NP  clindamycin (CLEOCIN) 300 MG capsule Take 1 capsule (300 mg total) by mouth 3 (three) times daily. 06/10/23   Margaretann Loveless, PA-C  rizatriptan (MAXALT-MLT) 10 MG disintegrating tablet Take 1 tablet (10 mg total) by mouth as needed for  migraine. May repeat in 2 hours if needed 11/22/22   Maretta Bees, PA    Family History Family History  Problem Relation Age of Onset   Hypertension Mother    Blindness Father     Social History Social History   Tobacco Use   Smoking status: Never   Smokeless tobacco: Never  Vaping Use   Vaping status: Never Used  Substance Use Topics   Alcohol use: Yes    Alcohol/week: 1.0 - 2.0 standard drink of alcohol    Types: 1 - 2 Cans of beer per week   Drug use: Never     Allergies   Bee venom and Penicillins   Review of Systems Review of Systems  Gastrointestinal:  Positive for diarrhea and vomiting.     Physical Exam Triage Vital Signs ED Triage Vitals  Encounter Vitals Group     BP 09/06/23 0857 122/75     Systolic BP Percentile --      Diastolic BP Percentile --      Pulse Rate 09/06/23 0857 61     Resp 09/06/23 0857 16     Temp 09/06/23 0857 97.7 F (36.5 C)     Temp src --      SpO2 09/06/23 0857 99 %     Weight --      Height --      Head Circumference --      Peak Flow --      Pain Score 09/06/23 0858 5  Pain Loc --      Pain Education --      Exclude from Growth Chart --    No data found.  Updated Vital Signs BP 122/75   Pulse 61   Temp 97.7 F (36.5 C)   Resp 16   SpO2 99%   Visual Acuity Right Eye Distance:   Left Eye Distance:   Bilateral Distance:    Right Eye Near:   Left Eye Near:    Bilateral Near:     Physical Exam Vitals reviewed.  Constitutional:      Appearance: Normal appearance.  HENT:     Head: Normocephalic and atraumatic.     Mouth/Throat:     Mouth: Mucous membranes are moist.     Pharynx: No oropharyngeal exudate or posterior oropharyngeal erythema.  Eyes:     Extraocular Movements: Extraocular movements intact.     Conjunctiva/sclera: Conjunctivae normal.     Pupils: Pupils are equal, round, and reactive to light.  Cardiovascular:     Rate and Rhythm: Normal rate and regular rhythm.  Pulmonary:      Effort: Pulmonary effort is normal.     Breath sounds: Normal breath sounds.  Abdominal:     General: Abdomen is flat. There is no distension.     Tenderness: There is no abdominal tenderness. There is no guarding or rebound.  Musculoskeletal:     Cervical back: Normal range of motion and neck supple.  Skin:    General: Skin is warm and dry.     Capillary Refill: Capillary refill takes less than 2 seconds.  Neurological:     Mental Status: He is alert.  Psychiatric:        Mood and Affect: Mood normal.    UC Treatments / Results  Labs (all labs ordered are listed, but only abnormal results are displayed) Labs Reviewed  POCT INFLUENZA A/B    EKG   Radiology No results found.  Procedures Procedures (including critical care time)  Medications Ordered in UC Medications  ketorolac (TORADOL) 30 MG/ML injection 30 mg (30 mg Intramuscular Given 09/06/23 0957)  dexamethasone (DECADRON) injection 10 mg (10 mg Intramuscular Given 09/06/23 0957)    Initial Impression / Assessment and Plan / UC Course  I have reviewed the triage vital signs and the nursing notes.  Pertinent labs & imaging results that were available during my care of the patient were reviewed by me and considered in my medical decision making (see chart for details).   Gastroenteritis, symptom management indicated only.  Prescribed Lomotil for diarrheal symptoms and Zofran for nausea and vomiting symptoms.  For acute headache Toradol and Decadron IM given in clinic.  Patient encouraged to advance diet as tolerated.  Influenza test is negative. Final Clinical Impressions(s) / UC Diagnoses   Final diagnoses:  Gastroenteritis  Tension headache     Discharge Instructions      Flu test is negative.  Suspect you have a viral stomach illness.  Continue with symptom management symptoms to gradually improve.  Zofran prescribed for nausea.  You received Toradol and Decadron here in clinic to help with your tension  headache.  Hydrate well with fluids.  Resume normal diet as tolerated.     ED Prescriptions     Medication Sig Dispense Auth. Provider   ondansetron (ZOFRAN) 4 MG tablet Take 1 tablet (4 mg total) by mouth every 8 (eight) hours as needed for nausea or vomiting. 20 tablet Bing Neighbors, NP   diphenoxylate-atropine (LOMOTIL)  2.5-0.025 MG tablet Take 1 tablet by mouth every 6 (six) hours as needed for diarrhea or loose stools. 30 tablet Bing Neighbors, NP      I have reviewed the PDMP during this encounter.   Bing Neighbors, NP 09/06/23 1050

## 2023-09-06 NOTE — Discharge Instructions (Signed)
 Flu test is negative.  Suspect you have a viral stomach illness.  Continue with symptom management symptoms to gradually improve.  Zofran prescribed for nausea.  You received Toradol and Decadron here in clinic to help with your tension headache.  Hydrate well with fluids.  Resume normal diet as tolerated.

## 2023-09-17 ENCOUNTER — Telehealth: Admitting: Physician Assistant

## 2023-09-17 DIAGNOSIS — R109 Unspecified abdominal pain: Secondary | ICD-10-CM | POA: Diagnosis not present

## 2023-09-17 DIAGNOSIS — N200 Calculus of kidney: Secondary | ICD-10-CM

## 2023-09-17 MED ORDER — TAMSULOSIN HCL 0.4 MG PO CAPS: 0.4000 mg | ORAL_CAPSULE | Freq: Every day | ORAL | 0 refills | Status: AC

## 2023-09-17 MED ORDER — NAPROXEN 500 MG PO TABS: 500.0000 mg | ORAL_TABLET | Freq: Two times a day (BID) | ORAL | 0 refills | Status: DC

## 2023-09-17 NOTE — Progress Notes (Signed)
   Thank you for the details you included in the comment boxes. Those details are very helpful in determining the best course of treatment for you and help [place] to provide the best care.Because kidney stones is not an issue we treat through E-visit questionnaire, we recommend that you schedule a Virtual Urgent Care video visit in order for the provider to better assess what is going on.  The provider will be able to give you a more accurate diagnosis and treatment plan if we can more freely discuss your symptoms and with the addition of a virtual examination.   If you change your visit to a video visit, we will bill your insurance (similar to an office visit) and you will not be charged for this e-Visit. You will be able to stay at home and speak with the first available [facility] advanced practice provider. The link to do a video visit is in the drop down Menu tab of your Welcome screen in MyChart.      I have spent 5 minutes in review of e-visit questionnaire, review and updating patient chart, medical decision making and response to patient.   Margaretann Loveless, PA-C

## 2023-09-17 NOTE — Progress Notes (Signed)
 Virtual Visit Consent   Walter Mendoza, you are scheduled for a virtual visit with a [facility] provider today. Just as with appointments in the office, your consent must be obtained to participate. Your consent will be active for this visit and any virtual visit you may have with one of our providers in the next 365 days. If you have a MyChart account, a copy of this consent can be sent to you electronically.  As this is a virtual visit, video technology does not allow for your provider to perform a traditional examination. This may limit your provider's ability to fully assess your condition. If your provider identifies any concerns that need to be evaluated in person or the need to arrange testing (such as labs, EKG, etc.), we will make arrangements to do so. Although advances in technology are sophisticated, we cannot ensure that it will always work on either your end or our end. If the connection with a video visit is poor, the visit may have to be switched to a telephone visit. With either a video or telephone visit, we are not always able to ensure that we have a secure connection.  By engaging in this virtual visit, you consent to the provision of healthcare and authorize for your insurance to be billed (if applicable) for the services provided during this visit. Depending on your insurance coverage, you may receive a charge related to this service.  I need to obtain your verbal consent now. Are you willing to proceed with your visit today? Walter Mendoza has provided verbal consent on 09/17/2023 for a virtual visit (video or telephone). Piedad Climes, [place]  Date: 09/17/2023 6:36 PM   Virtual Visit via Video Note   I, Piedad Climes, connected with  Walter Mendoza  ([id number], 03/29/1991) on 09/17/23 at  6:30 PM EDT by a video-enabled telemedicine application and verified that I am speaking with the correct person using two identifiers.  Location: Patient: Virtual Visit Location Patient:  Home Provider: Virtual Visit Location Provider: Home Office   I discussed the limitations of evaluation and management by telemedicine and the availability of in person appointments. The patient expressed understanding and agreed to proceed.    History of Present Illness: Walter Mendoza is a 33 y.o. who identifies as a male who was assigned male at birth, and is being seen today for concern of pain from kidney stone. Notes history of nephrolithiasis, most recent in 2023. As far as he knows the stone noted on that imaging has not passed. Notes sudden onset of R flank pain around 9PM last night. Had bouts of nausea due to the pain. Notes some relief after hydrating and emptying his bladder. Denies visible hematuria. Notes pain is colicky in nature and sometimes radiates into his testicle on the R side. Denies dysuria, urgency or frequency. Tried to get in with PCP but cannot see him before 4/10. Pain is minimal at present. Was given Flomax before with previous stones and is requesting a prescription for this.    HPI: HPI  Problems:  Patient Active Problem List   Diagnosis Date Noted   Tinea corporis 11/24/2021   Wrist pain, acute, right 11/24/2021   Wellness examination 10/10/2021   Pulmonary nodule 10/10/2021   Small bowel lesion 10/10/2021   Nephrolithiasis 09/28/2021   Flank pain 08/22/2021   Elevated blood pressure reading in office without diagnosis of hypertension 07/12/2021   History of nephrolithiasis 07/12/2021   Bilateral knee pain 07/12/2021    Allergies:  Allergies  Allergen Reactions   Bee Venom Anaphylaxis   Penicillins Hives   Medications:  Current Outpatient Medications:    diphenoxylate-atropine (LOMOTIL) 2.5-0.025 MG tablet, Take 1 tablet by mouth every 6 (six) hours as needed for diarrhea or loose stools., Disp: 30 tablet, Rfl: 0   ondansetron (ZOFRAN) 4 MG tablet, Take 1 tablet (4 mg total) by mouth every 8 (eight) hours as needed for nausea or vomiting., Disp: 20  tablet, Rfl: 0  Observations/Objective: Patient is well-developed, well-nourished in no acute distress.  Resting comfortably  at home.  Head is normocephalic, atraumatic.  No labored breathing.  Speech is clear and coherent with logical content.  Patient is alert and oriented at baseline.   Assessment and Plan: 1. Right flank pain (Primary)  Concern for obstructive nephrolithiasis. CT 2023 revealed 0.5 cm stone in inferior collecting system of R kidney. Symptoms are consistent with stone. Discussed that while happy to initiate Flomax and a pain medication (no controlled substances to be given via video visit), he needs follow-up in person with PCP or his urologist for repeat imaging to make sure the stone is still considered passable. Strict ER precautions reviewed. Rx Flomase and Narosyn sent to pharmacy.   Follow Up Instructions: I discussed the assessment and treatment plan with the patient. The patient was provided an opportunity to ask questions and all were answered. The patient agreed with the plan and demonstrated an understanding of the instructions.  A copy of instructions were sent to the patient via MyChart unless otherwise noted below.   The patient was advised to call back or seek an in-person evaluation if the symptoms worsen or if the condition fails to improve as anticipated.    Piedad Climes, PA-C

## 2023-09-17 NOTE — Patient Instructions (Signed)
 Walter Mendoza, thank you for joining Piedad Climes, PA-C for today's virtual visit.  While this provider is not your primary care provider (PCP), if your PCP is located in our provider database this encounter information will be shared with them immediately following your visit.   A [facility] MyChart account gives you access to today's visit and all your visits, tests, and labs performed at [facility] " click here if you don't have a [facility] MyChart account or go to [email or web address]  Consent: (Patient) Walter Mendoza provided verbal consent for this virtual visit at the beginning of the encounter.  Current Medications:  Current Outpatient Medications:    naproxen (NAPROSYN) 500 MG tablet, Take 1 tablet (500 mg total) by mouth 2 (two) times daily with a meal., Disp: 20 tablet, Rfl: 0   tamsulosin (FLOMAX) 0.4 MG CAPS capsule, Take 1 capsule (0.4 mg total) by mouth daily after supper., Disp: 30 capsule, Rfl: 0   diphenoxylate-atropine (LOMOTIL) 2.5-0.025 MG tablet, Take 1 tablet by mouth every 6 (six) hours as needed for diarrhea or loose stools., Disp: 30 tablet, Rfl: 0   ondansetron (ZOFRAN) 4 MG tablet, Take 1 tablet (4 mg total) by mouth every 8 (eight) hours as needed for nausea or vomiting., Disp: 20 tablet, Rfl: 0   Medications ordered in this encounter:  Meds ordered this encounter  Medications   naproxen (NAPROSYN) 500 MG tablet    Sig: Take 1 tablet (500 mg total) by mouth 2 (two) times daily with a meal.    Dispense:  20 tablet    Refill:  0    Supervising Provider:   Merrilee Jansky [[id number]]   tamsulosin (FLOMAX) 0.4 MG CAPS capsule    Sig: Take 1 capsule (0.4 mg total) by mouth daily after supper.    Dispense:  30 capsule    Refill:  0    Supervising Provider:   Merrilee Jansky [[id number]]     *If you need refills on other medications prior to your next appointment, please contact your pharmacy*  Follow-Up: Call back or seek an in-person  evaluation if the symptoms worsen or if the condition fails to improve as anticipated.  [facility] [phone]  Other Instructions  Please call your PCP or Urologist first thing tomorrow to set up a follow-up appt for an acute kidney stone.  Any significant worsening of symptoms, you need an ER evaluation ASAP. I have sent the Flonase and prescription strength Naproxen to the pharmacy to take as directed.  Kidney Stones Kidney stones are rock-like masses that form inside of the kidneys. Kidneys are organs that make pee (urine). A kidney stone may move into other parts of the urinary tract, including: The tubes that connect the kidneys to the bladder (ureters). The bladder. The tube that carries urine out of the body (urethra). Kidney stones can cause very bad pain and can block the flow of pee. The stone usually leaves your body through your pee. A doctor may need to take out the stone. What are the causes? Kidney stones may be caused by: Too much calcium in the body. This may be caused by too much parathyroid hormone in the blood. Uric acid crystals in the bladder. The body makes uric acid when you eat certain foods. Narrowing of one or both of the ureters. A kidney blockage that you were born with. Past surgery on the kidney or the ureters. What increases the risk? You are more likely to develop  this condition if: You have had a kidney stone in the past. Other people in your family have had kidney stones. You do not drink enough water. You eat a diet that is high in protein, salt (sodium), or sugar. You are very overweight (obese). What are the signs or symptoms? Symptoms of a kidney stone may include: Pain in the side of the belly, right below the ribs. Pain usually spreads to the groin. Needing to pee often or right away. Pain when peeing. Blood in your pee. Feeling like you may vomit (nauseous). Vomiting. Fever and chills. How is this treated? Treatment  depends on the size, location, and makeup of the kidney stones. The stones will often pass out of the body when you pee. You may need to: Drink more fluid to help pass the stone. In some cases, you may be given fluids through an IV tube at the hospital. Take medicine for pain. Change your diet to help keep kidney stones from coming back. Sometimes, you may need: A procedure to break up kidney stones using a beam of light (laser) or shock waves. Surgery to remove the kidney stones. Follow these instructions at home: Medicines Take over-the-counter and prescription medicines only as told by your doctor. Ask your doctor if the medicine prescribed to you requires you to avoid driving or using machinery. Eating and drinking Drink enough fluid to keep your pee pale yellow. You may be told to drink at least 8-10 glasses of water each day. This will help you pass the stone. If told by your doctor, change your diet. You may be told to: Limit how much salt you eat. Eat more fruits and vegetables. Limit how much meat, poultry, fish, and eggs you eat. Follow instructions from your doctor about what you may eat and drink. General instructions Collect pee samples as told by your doctor. You may need to collect a pee sample: 24 hours after a stone comes out. 8-12 weeks after a stone comes out, and every 6-12 months after that. Strain your pee every time you pee. Use the strainer that your doctor recommends. Do not throw out the stone. Keep it so that it can be tested by your doctor. Keep all follow-up visits. You may need X-rays and ultrasounds to make sure the stone has come out. How is this prevented? To prevent another kidney stone: Drink enough fluid to keep your pee pale yellow. This is the best way to prevent kidney stones. Eat healthy foods. Avoid certain foods as told by your doctor. You may be told to eat less protein. Stay at a healthy weight. Where to find more information National  Kidney Foundation (NKF): kidney.org [facility]): urologyhealth.org Contact a doctor if: You have pain that gets worse or does not get better with medicine. Get help right away if: You have a fever or chills. You get very bad pain. You get new pain in your belly. You faint. You cannot pee. This information is not intended to replace advice given to you by your health care provider. Make sure you discuss any questions you have with your health care provider. Document Revised: 02/02/2022 Document Reviewed: 02/02/2022 Elsevier Patient Education  2024 Elsevier Inc.   If you have been instructed to have an in-person evaluation today at a local Urgent Care facility, please use the link below. It will take you to a list of all of our available [facility], including address, phone number and hours of operation. Please do  not delay care.  [facility]  If you or a family member do not have a primary care provider, use the link below to schedule a visit and establish care. When you choose a [facility] primary care physician or advanced practice provider, you gain a long-term partner in health. Find a Primary Care Provider  Learn more about [facility]'s in-office and virtual care options: [facility] - Get Care Now

## 2023-10-03 ENCOUNTER — Encounter (HOSPITAL_BASED_OUTPATIENT_CLINIC_OR_DEPARTMENT_OTHER): Payer: Self-pay | Admitting: Family Medicine

## 2023-10-03 ENCOUNTER — Telehealth (HOSPITAL_BASED_OUTPATIENT_CLINIC_OR_DEPARTMENT_OTHER): Payer: Self-pay | Admitting: Family Medicine

## 2023-10-03 ENCOUNTER — Ambulatory Visit (INDEPENDENT_AMBULATORY_CARE_PROVIDER_SITE_OTHER): Admitting: Family Medicine

## 2023-10-03 VITALS — BP 134/77 | HR 56 | Ht 74.0 in | Wt 217.8 lb

## 2023-10-03 DIAGNOSIS — Z0000 Encounter for general adult medical examination without abnormal findings: Secondary | ICD-10-CM

## 2023-10-03 DIAGNOSIS — R109 Unspecified abdominal pain: Secondary | ICD-10-CM

## 2023-10-03 DIAGNOSIS — L608 Other nail disorders: Secondary | ICD-10-CM | POA: Insufficient documentation

## 2023-10-03 DIAGNOSIS — R911 Solitary pulmonary nodule: Secondary | ICD-10-CM | POA: Diagnosis not present

## 2023-10-03 NOTE — Patient Instructions (Signed)
  Medication Instructions:  Your physician recommends that you continue on your current medications as directed. Please refer to the Current Medication list given to you today. --If you need a refill on any your medications before your next appointment, please call your pharmacy first. If no refills are authorized on file call the office.-- Lab Work: Your physician has recommended that you have lab work today: today If you have labs (blood work) drawn today and your tests are completely normal, you will receive your results via MyChart message OR a phone call from our staff.  Please ensure you check your voicemail in the event that you authorized detailed messages to be left on a delegated number. If you have any lab test that is abnormal or we need to change your treatment, we will call you to review the results.  Referrals/Procedures/Imaging: CT SCAN  Follow-Up: Your next appointment:   Your physician recommends that you schedule a follow-up appointment in: 2-3 month physical  with Dr. de [place]  You will receive a text message or e-mail with a link to a survey about your care and experience with [place] today! We would greatly appreciate your feedback!   Thanks for letting us be apart of your health journey!!  Primary Care and Sports Medicine   Dr. Ceasar Mons [place]   We encourage you to activate your patient portal called "MyChart".  Sign up information is provided on this After Visit Summary.  MyChart is used to connect with patients for Virtual Visits (Telemedicine).  Patients are able to view lab/test results, encounter notes, upcoming appointments, etc.  Non-urgent messages can be sent to your provider as well. To learn more about what you can do with MyChart, please visit --  [email or web address].

## 2023-10-03 NOTE — Assessment & Plan Note (Signed)
 Previously noted pulmonary nodule.  We did order CT scan for follow-up regarding this, however patient ultimately did not have this done.  We discussed considerations related to this today and he would be open to having CT scan completed.  Order placed today.  We will follow-up on results and recommendations related to the follow-up monitoring

## 2023-10-03 NOTE — Telephone Encounter (Signed)
 Copied from CRM [id number]. Topic: Clinical - Request for Lab/Test Order >> Oct 03, 2023  4:39 PM Shon Hale wrote: Reason for CRM: Claris Gower w/ [facility] requesting prior authorization for upcoming appointment. Pt having Chest CT w/o contrast.

## 2023-10-03 NOTE — Progress Notes (Signed)
    Procedures performed today:    None.  Independent interpretation of notes and tests performed by another provider:   None.  Brief History, Exam, Impression, and Recommendations:    BP 134/77 (BP Location: Right Arm, Patient Position: Sitting, Cuff Size: Normal)   Pulse (!) 56   Ht 6\' 2"  (1.88 m)   Wt 217 lb 12.8 oz (98.8 kg)   SpO2 100%   BMI 27.96 kg/m   Pulmonary nodule Assessment & Plan: Previously noted pulmonary nodule.  We did order CT scan for follow-up regarding this, however patient ultimately did not have this done.  We discussed considerations related to this today and he would be open to having CT scan completed.  Order placed today.  We will follow-up on results and recommendations related to the follow-up monitoring  Orders: -     CT CHEST WO CONTRAST; Future  Flank pain Assessment & Plan: Patient had episode of flank pain last month.  He did have virtual visit completed and was prescribed Flomax as well as medication to help with pain.  He reports that he has been doing well, however has not had recent follow-up with urology.  He currently denies any significant symptoms related to flank pain.  No pain or burning with urination, no blood noted in urine. No specific acute treatment or testing to be done today given that symptoms are largely resolved for patient.  Advised that he could follow-up with urology to discuss further as his last appointment was with them about 2 years ago.  Patient indicates that he will do so.  If any other acute issues do arise, can return to office regarding this   Disorder of nail coloration Assessment & Plan: Patient reports that he has noticed some slight nail discoloration, denies any significant thickening per se.  Has tried topical treatment.  Requesting referral to podiatry for further evaluation Can proceed with referral for further assessment related to toenail changes  Orders: -     Ambulatory referral to  Podiatry  Wellness examination -     CBC with Differential/Platelet -     Comprehensive metabolic panel with GFR -     Hemoglobin A1c -     Lipid panel -     TSH Rfx on Abnormal to Free T4  Return in about 2 months (around 12/03/2023) for CPE with fasting labs 1 week prior.  Spent 34 minutes on this patient encounter, including preparation, chart review, face-to-face counseling with patient and coordination of care, and documentation of encounter   ___________________________________________ Caasi Giglia de [place], MD, ABFM, [facility]

## 2023-10-03 NOTE — Assessment & Plan Note (Signed)
 Patient had episode of flank pain last month.  He did have virtual visit completed and was prescribed Flomax as well as medication to help with pain.  He reports that he has been doing well, however has not had recent follow-up with urology.  He currently denies any significant symptoms related to flank pain.  No pain or burning with urination, no blood noted in urine. No specific acute treatment or testing to be done today given that symptoms are largely resolved for patient.  Advised that he could follow-up with urology to discuss further as his last appointment was with them about 2 years ago.  Patient indicates that he will do so.  If any other acute issues do arise, can return to office regarding this

## 2023-10-03 NOTE — Assessment & Plan Note (Signed)
 Patient reports that he has noticed some slight nail discoloration, denies any significant thickening per se.  Has tried topical treatment.  Requesting referral to podiatry for further evaluation Can proceed with referral for further assessment related to toenail changes

## 2023-10-04 LAB — LIPID PANEL
Chol/HDL Ratio: 2.9 ratio (ref 0.0–5.0)
Cholesterol, Total: 193 mg/dL (ref 100–199)
HDL: 66 mg/dL (ref >39)
LDL Chol Calc (NIH): 109 mg/dL — ABNORMAL HIGH (ref 0–99)
Triglycerides: 99 mg/dL (ref 0–149)
VLDL Cholesterol Cal: 18 mg/dL (ref 5–40)

## 2023-10-04 LAB — CBC WITH DIFFERENTIAL/PLATELET
Basophils Absolute: 0.1 10*3/uL (ref 0.0–0.2)
Basos: 1 %
EOS (ABSOLUTE): 0.6 10*3/uL — ABNORMAL HIGH (ref 0.0–0.4)
Eos: 8 %
Hematocrit: 41.2 % (ref 37.5–51.0)
Hemoglobin: 13.8 g/dL (ref 13.0–17.7)
Immature Grans (Abs): 0 10*3/uL (ref 0.0–0.1)
Immature Granulocytes: 0 %
Lymphocytes Absolute: 2.9 10*3/uL (ref 0.7–3.1)
Lymphs: 37 %
MCH: 30.3 pg (ref 26.6–33.0)
MCHC: 33.5 g/dL (ref 31.5–35.7)
MCV: 91 fL (ref 79–97)
Monocytes Absolute: 0.5 10*3/uL (ref 0.1–0.9)
Monocytes: 6 %
Neutrophils Absolute: 3.7 10*3/uL (ref 1.4–7.0)
Neutrophils: 48 %
Platelets: 247 10*3/uL (ref 150–450)
RBC: 4.55 x10E6/uL (ref 4.14–5.80)
RDW: 11.8 % (ref 11.6–15.4)
WBC: 7.8 10*3/uL (ref 3.4–10.8)

## 2023-10-04 LAB — COMPREHENSIVE METABOLIC PANEL WITH GFR
ALT: 22 IU/L (ref 0–44)
AST: 28 IU/L (ref 0–40)
Albumin: 3.8 g/dL — ABNORMAL LOW (ref 4.1–5.1)
Alkaline Phosphatase: 58 IU/L (ref 44–121)
BUN/Creatinine Ratio: 11 (ref 9–20)
BUN: 11 mg/dL (ref 6–20)
Bilirubin Total: <0.2 mg/dL (ref 0.0–1.2)
CO2: 23 mmol/L (ref 20–29)
Calcium: 9.1 mg/dL (ref 8.7–10.2)
Chloride: 104 mmol/L (ref 96–106)
Creatinine, Ser: 0.96 mg/dL (ref 0.76–1.27)
Globulin, Total: 2.5 g/dL (ref 1.5–4.5)
Glucose: 79 mg/dL (ref 70–99)
Potassium: 4.4 mmol/L (ref 3.5–5.2)
Sodium: 139 mmol/L (ref 134–144)
Total Protein: 6.3 g/dL (ref 6.0–8.5)
eGFR: 106 mL/min/{1.73_m2} (ref >59)

## 2023-10-04 LAB — TSH RFX ON ABNORMAL TO FREE T4: TSH: 0.654 u[IU]/mL (ref 0.450–4.500)

## 2023-10-04 LAB — HEMOGLOBIN A1C
Est. average glucose Bld gHb Est-mCnc: 100 mg/dL
Hgb A1c MFr Bld: 5.1 % (ref 4.8–5.6)

## 2023-10-08 ENCOUNTER — Ambulatory Visit: Admitting: Podiatry

## 2023-10-08 ENCOUNTER — Other Ambulatory Visit

## 2023-10-21 ENCOUNTER — Encounter (HOSPITAL_BASED_OUTPATIENT_CLINIC_OR_DEPARTMENT_OTHER): Payer: Self-pay | Admitting: Family Medicine

## 2023-10-29 ENCOUNTER — Ambulatory Visit
Admission: RE | Admit: 2023-10-29 | Discharge: 2023-10-29 | Disposition: A | Discharge status: Home / Self Care | Source: Ambulatory Visit | Attending: Family Medicine | Admitting: Family Medicine

## 2023-10-29 DIAGNOSIS — R911 Solitary pulmonary nodule: Secondary | ICD-10-CM

## 2023-11-20 ENCOUNTER — Ambulatory Visit (HOSPITAL_BASED_OUTPATIENT_CLINIC_OR_DEPARTMENT_OTHER): Payer: Self-pay | Admitting: Family Medicine

## 2023-12-05 ENCOUNTER — Encounter (HOSPITAL_BASED_OUTPATIENT_CLINIC_OR_DEPARTMENT_OTHER): Admitting: Family Medicine

## 2024-01-17 ENCOUNTER — Telehealth: Admitting: Family Medicine

## 2024-01-17 DIAGNOSIS — J029 Acute pharyngitis, unspecified: Secondary | ICD-10-CM | POA: Diagnosis not present

## 2024-01-18 NOTE — Progress Notes (Signed)
 E-Visit for Sore Throat  We are sorry that you are not feeling well.  Here is how we plan to help!  Your symptoms indicate a likely viral infection (Pharyngitis).   Pharyngitis is inflammation in the back of the throat which can cause a sore throat, scratchiness and sometimes difficulty swallowing.   Pharyngitis is typically caused by a respiratory virus and will just run its course.  Please keep in mind that your symptoms could last up to 10 days.  For throat pain, we recommend over the counter oral pain relief medications such as acetaminophen or aspirin, or anti-inflammatory medications such as ibuprofen or naproxen sodium.  Topical treatments such as oral throat lozenges or sprays may be used as needed.  Avoid close contact with loved ones, especially the very young and elderly.  Remember to wash your hands thoroughly throughout the day as this is the number one way to prevent the spread of infection and wipe down door knobs and counters with disinfectant.  After careful review of your answers, I would not recommend an antibiotic for your condition.  Antibiotics should not be used to treat conditions that we suspect are caused by viruses like the virus that causes the common cold or flu. However, some people can have Strep with atypical symptoms. You may need formal testing in clinic or office to confirm if your symptoms continue or worsen.  Providers prescribe antibiotics to treat infections caused by bacteria. Antibiotics are very powerful in treating bacterial infections when they are used properly.  To maintain their effectiveness, they should be used only when necessary.  Overuse of antibiotics has resulted in the development of super bugs that are resistant to treatment!    Home Care: Only take medications as instructed by your medical team. Do not drink alcohol while taking these medications. A steam or ultrasonic humidifier can help congestion.  You can place a towel over your head and  breathe in the steam from hot water coming from a faucet. Avoid close contacts especially the very young and the elderly. Cover your mouth when you cough or sneeze. Always remember to wash your hands.  Get Help Right Away If: You develop worsening fever or throat pain. You develop a severe head ache or visual changes. Your symptoms persist after you have completed your treatment plan.  Make sure you Understand these instructions. Will watch your condition. Will get help right away if you are not doing well or get worse.   Thank you for choosing an e-visit.  Your e-visit answers were reviewed by a board certified advanced clinical practitioner to complete your personal care plan. Depending upon the condition, your plan could have included both over the counter or prescription medications.  Please review your pharmacy choice. Make sure the pharmacy is open so you can pick up prescription now. If there is a problem, you may contact your provider through [employer] and have the prescription routed to another pharmacy.  Your safety is important to [place]. If you have drug allergies check your prescription carefully.   For the next 24 hours you can use MyChart to ask questions about today's visit, request a non-urgent call back, or ask for a work or school excuse. You will get an email in the next two days asking about your experience. I hope that your e-visit has been valuable and will speed your recovery.    have provided 5 minutes of non face to face time during this encounter for chart review and documentation.

## 2024-03-24 ENCOUNTER — Ambulatory Visit (INDEPENDENT_AMBULATORY_CARE_PROVIDER_SITE_OTHER): Admitting: Family Medicine

## 2024-03-24 ENCOUNTER — Telehealth (HOSPITAL_BASED_OUTPATIENT_CLINIC_OR_DEPARTMENT_OTHER): Payer: Self-pay | Admitting: *Deleted

## 2024-03-24 ENCOUNTER — Encounter (HOSPITAL_BASED_OUTPATIENT_CLINIC_OR_DEPARTMENT_OTHER): Payer: Self-pay | Admitting: Family Medicine

## 2024-03-24 VITALS — BP 129/78 | HR 62 | Ht 74.0 in | Wt 210.6 lb

## 2024-03-24 DIAGNOSIS — Z0000 Encounter for general adult medical examination without abnormal findings: Secondary | ICD-10-CM

## 2024-03-24 NOTE — Progress Notes (Signed)
 Subjective:    CC: Annual Physical Exam  HPI:  Walter Mendoza is a 33 y.o. presenting for annual physical  I reviewed the past medical history, family history, social history, surgical history, and allergies today and no changes were needed.  Please see the problem list section below in epic for further details.  Past Medical History: Past Medical History:  Diagnosis Date   Kidney stones    Past Surgical History: History reviewed. No pertinent surgical history. Social History: Social History   Socioeconomic History   Marital status: Married    Spouse name: Not on file   Number of children: Not on file   Years of education: Not on file   Highest education level: Not on file  Occupational History   Not on file  Tobacco Use   Smoking status: Never    Passive exposure: Never   Smokeless tobacco: Never  Vaping Use   Vaping status: Never Used  Substance and Sexual Activity   Alcohol use: Yes    Alcohol/week: 1.0 - 2.0 standard drink of alcohol    Types: 1 - 2 Cans of beer per week   Drug use: Never   Sexual activity: Yes  Other Topics Concern   Not on file  Social History Narrative   Not on file   Social Drivers of Health   Financial Resource Strain: Not on file  Food Insecurity: Not on file  Transportation Needs: Not on file  Physical Activity: Not on file  Stress: Not on file  Social Connections: Not on file   Family History: Family History  Problem Relation Age of Onset   Hypertension Mother    Blindness Father    Allergies: Allergies  Allergen Reactions   Bee Venom Anaphylaxis   Penicillins Hives   Medications: See med rec.  Review of Systems: No headache, visual changes, nausea, vomiting, diarrhea, constipation, dizziness, abdominal pain, skin rash, fevers, chills, night sweats, swollen lymph nodes, weight loss, chest pain, body aches, joint swelling, muscle aches, shortness of breath, mood changes, visual or auditory hallucinations.  Objective:     BP 129/78 (BP Location: Right Arm, Patient Position: Sitting, Cuff Size: Large)   Pulse 62   Ht 6' 2 (1.88 m)   Wt 210 lb 9.6 oz (95.5 kg)   SpO2 99%   BMI 27.04 kg/m   General: Well Developed, well nourished, and in no acute distress.  Neuro: Alert and oriented x3, extra-ocular muscles intact, sensation grossly intact. Cranial nerves II through XII are intact, motor, sensory, and coordinative functions are all intact. HEENT: Normocephalic, atraumatic, pupils equal round reactive to light, neck supple, no masses, no lymphadenopathy, thyroid nonpalpable. Oropharynx, nasopharynx, external ear canals are unremarkable. Skin: Warm and dry, no rashes noted.  Cardiac: Regular rate and rhythm, no murmurs rubs or gallops.  Respiratory: Clear to auscultation bilaterally. Not using accessory muscles, speaking in full sentences.  Abdominal: Soft, nontender, nondistended, positive bowel sounds, no masses, no organomegaly.  Musculoskeletal: Shoulder, elbow, wrist, hip, knee, ankle stable, and with full range of motion.  Impression and Recommendations:    Wellness examination Assessment & Plan: Routine HCM labs reviewed. HCM reviewed/discussed. Anticipatory guidance regarding healthy weight, lifestyle and choices given. Recommend healthy diet.  Recommend approximately 150 minutes/week of moderate intensity exercise Recommend regular dental and vision exams Always use seatbelt/lap and shoulder restraints Recommend using smoke alarms and checking batteries at least twice a year Recommend using sunscreen when outside Discussed immunization recommendations   Return in about 1 year (  around 03/24/2025) for CPE.   ___________________________________________ Kenishia Plack de [place], MD, ABFM, [facility]

## 2024-03-24 NOTE — Telephone Encounter (Signed)
**Note De-identified  [place] Obfuscation** Please advise 

## 2024-03-24 NOTE — Assessment & Plan Note (Signed)

## 2024-03-24 NOTE — Telephone Encounter (Signed)
 Copied from CRM [id number]. Topic: Clinical - Request for Lab/Test Order >> Mar 24, 2024  4:24 PM Anairis L wrote: Reason for CRM: Patient came in today fror a physical but no labs where taken. Wife would like to have a lab order placed. ASAP.

## 2024-03-24 NOTE — Patient Instructions (Signed)
  Medication Instructions:  Your physician recommends that you continue on your current medications as directed. Please refer to the Current Medication list given to you today. --If you need a refill on any your medications before your next appointment, please call your pharmacy first. If no refills are authorized on file call the office.-- Lab Work: Your physician has recommended that you have lab work today: 1 week before next visit If you have labs (blood work) drawn today and your tests are completely normal, you will receive your results via MyChart message OR a phone call from our staff.  Please ensure you check your voicemail in the event that you authorized detailed messages to be left on a delegated number. If you have any lab test that is abnormal or we need to change your treatment, we will call you to review the results.    Follow-Up: Your next appointment:   Your physician recommends that you schedule a follow-up appointment in: 1 year physical with Dr. de [place]  You will receive a text message or e-mail with a link to a survey about your care and experience with [place] today! We would greatly appreciate your feedback!   Thanks for letting us be apart of your health journey!!  Primary Care and Sports Medicine   Dr. Ceasar Mons [place]   We encourage you to activate your patient portal called "MyChart".  Sign up information is provided on this After Visit Summary.  MyChart is used to connect with patients for Virtual Visits (Telemedicine).  Patients are able to view lab/test results, encounter notes, upcoming appointments, etc.  Non-urgent messages can be sent to your provider as well. To learn more about what you can do with MyChart, please visit --  [email or web address].

## 2024-03-25 NOTE — Telephone Encounter (Signed)
 Patient wife advised labs were not needed at the time of visit and would be drawn 1 week before his next physical with provider with verbal understanding

## 2024-06-19 ENCOUNTER — Other Ambulatory Visit (HOSPITAL_BASED_OUTPATIENT_CLINIC_OR_DEPARTMENT_OTHER): Payer: Self-pay

## 2024-06-19 ENCOUNTER — Emergency Department
Admission: EM | Admit: 2024-06-19 | Discharge: 2024-06-19 | Disposition: A | Discharge status: Home / Self Care | Source: Home / Self Care

## 2024-06-19 ENCOUNTER — Ambulatory Visit: Payer: Self-pay

## 2024-06-19 ENCOUNTER — Other Ambulatory Visit: Payer: Self-pay

## 2024-06-19 ENCOUNTER — Emergency Department (HOSPITAL_BASED_OUTPATIENT_CLINIC_OR_DEPARTMENT_OTHER): Admission: EM | Admit: 2024-06-19 | Discharge: 2024-06-19 | Discharge status: Against Medical Advice

## 2024-06-19 VITALS — BP 123/71 | HR 58 | Temp 97.9°F | Resp 18 | Ht 74.0 in | Wt 215.0 lb

## 2024-06-19 DIAGNOSIS — Z5321 Procedure and treatment not carried out due to patient leaving prior to being seen by health care provider: Secondary | ICD-10-CM | POA: Insufficient documentation

## 2024-06-19 DIAGNOSIS — M542 Cervicalgia: Secondary | ICD-10-CM | POA: Insufficient documentation

## 2024-06-19 DIAGNOSIS — R519 Headache, unspecified: Secondary | ICD-10-CM | POA: Insufficient documentation

## 2024-06-19 DIAGNOSIS — S161XXA Strain of muscle, fascia and tendon at neck level, initial encounter: Secondary | ICD-10-CM | POA: Diagnosis not present

## 2024-06-19 MED ORDER — TIZANIDINE HCL 4 MG PO TABS
4.0000 mg | ORAL_TABLET | Freq: Three times a day (TID) | ORAL | 0 refills | Status: AC | PRN
  Filled 2024-06-19: qty 30, 10d supply, fill #0

## 2024-06-19 MED ORDER — DICLOFENAC SODIUM 75 MG PO TBEC
75.0000 mg | DELAYED_RELEASE_TABLET | Freq: Two times a day (BID) | ORAL | 0 refills | Status: AC
  Filled 2024-06-19: qty 30, 15d supply, fill #0

## 2024-06-19 NOTE — Telephone Encounter (Signed)
 FYI Only or Action Required?: FYI only for provider: appointment scheduled on 12.26.25 @ [facility].  Patient was last seen in primary care on 03/24/2024 by [place], Walter PARAS, MD.  Called Nurse Triage reporting Neck Pain and Headache.  Symptoms began several days ago.  Interventions attempted: OTC medications: ibuprofen and a migraine medicine found in the cabinet.  Symptoms are: unchanged.  Triage Disposition: See PCP When Office is Open (Within 3 Days)  Patient/caregiver understands and will follow disposition?: Yes   Copied from CRM #[id number]. Topic: Clinical - Red Word Triage >> Jun 19, 2024 11:22 AM Walter Mendoza wrote: Red Word that prompted transfer to Nurse Triage: neck pain and headache started 3 days ago Reason for Disposition  [1] MODERATE neck pain (e.g., interferes with normal activities) AND [2] present > 3 days  Answer Assessment - Initial Assessment Questions Pt started with bilateral neck pain x 3 days along with headache. Now it is it just on his right side along with the headache. Wife states he has a history of migraines he can't shake and soemtimes needs shots for but that he doesn't have the neck pain typically with it. She states he went to [place] last night but sat their for a couple of hours and then left. States ibuprofen and migraine med brought it down to a 6/10. Has tried ice and heat. Bp was normal. Does lift heavy pallets for work. Pt denies any double vision, blurry vision, fever, weakness or numbness or other higher acuity symptoms. Pt does have a UC appt today at 1pm. Rn advised them to keep that appt today. Wife stated understanding.    1. ONSET: When did the pain begin?      3 days ago 2. LOCATION: Where does it hurt?       3. PATTERN Does the pain come and go, or has it been constant since it started?      Constant but intensity changes 4. SEVERITY: How bad is the pain?  (Scale 0-10; or none or slight stiffness, mild, moderate, severe)      8/10 5. RADIATION: Does the pain go anywhere else, shoot into your arms?     denies 6. CORD SYMPTOMS: Any weakness or numbness of the arms or legs?     Denies 7. CAUSE: What do you think is causing the neck pain?     unknown 8. NECK OVERUSE: Any recent activities that involved turning or twisting the neck?     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 OTHER SYMPTOMS: Do you have any other symptoms? (e.g., headache, fever, chest pain, difficulty breathing, neck swelling)     headache  Protocols used: Neck Pain or Stiffness-A-AH

## 2024-06-19 NOTE — ED Provider Notes (Signed)
 " [facility]  Note:  This document was prepared using Dragon voice recognition software and may include unintentional dictation errors.  MRN: [id number] DOB: January 23, 1991  Subjective:   Walter Mendoza is a 33 y.o. male presenting for right sided neck pain with headaches x 3 days.  Patient reports that pain has been in the right posterior neck with now radiation to the left side.  Patient has been taking ibuprofen 800 mg with minimal improvement symptoms.  Patient denies any known trauma or injury to the neck.  Patient reports that he fell while walking down some steps couple of months ago and was unsure if this may be related.  Patient denies landing on his neck or shoulder during fall.  Current Medications[1]   Allergies[2]  Past Medical History:  Diagnosis Date   Kidney stones      History reviewed. No pertinent surgical history.  Family History  Problem Relation Age of Onset   Hypertension Mother    Blindness Father     Social History[3]  ROS Refer to HPI for ROS details.  Objective:    Vitals: BP 123/71 (BP Location: Right Arm)   Pulse (!) 58   Temp 97.9 F (36.6 C) (Oral)   Resp 18   Ht 6' 2 (1.88 m)   Wt 215 lb (97.5 kg)   SpO2 99%   BMI 27.60 kg/m   Physical Exam Vitals and nursing note reviewed.  Constitutional:      General: He is not in acute distress.    Appearance: He is well-developed. He is not ill-appearing or toxic-appearing.  HENT:     Head: Normocephalic.  Cardiovascular:     Rate and Rhythm: Normal rate.  Pulmonary:     Effort: Pulmonary effort is normal. No respiratory distress.     Breath sounds: No stridor. No wheezing.  Musculoskeletal:     Cervical back: Normal range of motion and neck supple. Torticollis and tenderness present. No rigidity. Pain with movement present. Normal range of motion.  Lymphadenopathy:     Cervical: No cervical adenopathy.  Skin:    General: Skin is warm and dry.  Neurological:     General: No  focal deficit present.     Mental Status: He is alert and oriented to person, place, and time.  Psychiatric:        Mood and Affect: Mood normal.        Behavior: Behavior normal.     Procedures  No results found for this or any previous visit (from the past 24 hours).  Assessment and Plan :     Discharge Instructions      1. Cervical muscle strain, initial encounter (Primary) - diclofenac  (VOLTAREN ) 75 MG EC tablet; Take 1 tablet (75 mg total) by mouth 2 (two) times daily.  Dispense: 30 tablet; Refill: 0 - tiZANidine  (ZANAFLEX ) 4 MG tablet; Take 1 tablet (4 mg total) by mouth every 8 (eight) hours as needed for muscle spasms.  Dispense: 30 tablet; Refill: 0 - Apply warm compress to muscle prior to any physical activity or with any increase in muscle spasms or pain to loosen muscle and cause tension to relax.  -Continue to monitor symptoms for any change in severity if there is any escalation of current symptoms or development of new symptoms follow-up in ER for further evaluation and management.       Cystal Shannahan B [place] Kupfer    [1] No current facility-administered medications for this encounter.  Current Outpatient Medications:  diclofenac  (VOLTAREN ) 75 MG EC tablet, Take 1 tablet (75 mg total) by mouth 2 (two) times daily., Disp: 30 tablet, Rfl: 0   tiZANidine  (ZANAFLEX ) 4 MG tablet, Take 1 tablet (4 mg total) by mouth every 8 (eight) hours as needed for muscle spasms., Disp: 30 tablet, Rfl: 0   ondansetron  (ZOFRAN ) 4 MG tablet, Take 1 tablet (4 mg total) by mouth every 8 (eight) hours as needed for nausea or vomiting., Disp: 20 tablet, Rfl: 0   tamsulosin  (FLOMAX ) 0.4 MG CAPS capsule, Take 1 capsule (0.4 mg total) by mouth daily after supper. (Patient not taking: No sig reported), Disp: 30 capsule, Rfl: 0 [2]  Allergies Allergen Reactions   Bee Venom Anaphylaxis   Penicillins Hives  [3]  Social History Tobacco Use   Smoking status: Never    Passive exposure: Never    Smokeless tobacco: Never  Vaping Use   Vaping status: Never Used  Substance Use Topics   Alcohol use: Not Currently    Alcohol/week: 1.0 - 2.0 standard drink of alcohol    Types: 1 - 2 Cans of beer per week   Drug use: Never     Aurea Goodell B, NP 06/19/24 1416  "

## 2024-06-19 NOTE — ED Triage Notes (Signed)
 Pt states that he has some right sided neck pain and headache. X3 days. Pt states that the headache is on the right side but radiates to the left.   Pt states that he has been taking Ibuprofen.

## 2024-06-19 NOTE — ED Triage Notes (Signed)
 Pt to ED via POV with c/o bilateral lateral neck pain x 3 days along with headache, denies recent injury, denies cough,sore throat, fever, chills, congestion.

## 2024-06-19 NOTE — Discharge Instructions (Addendum)
 1. Cervical muscle strain, initial encounter (Primary) - diclofenac  (VOLTAREN ) 75 MG EC tablet; Take 1 tablet (75 mg total) by mouth 2 (two) times daily.  Dispense: 30 tablet; Refill: 0 - tiZANidine  (ZANAFLEX ) 4 MG tablet; Take 1 tablet (4 mg total) by mouth every 8 (eight) hours as needed for muscle spasms.  Dispense: 30 tablet; Refill: 0 - Apply warm compress to muscle prior to any physical activity or with any increase in muscle spasms or pain to loosen muscle and cause tension to relax.  -Continue to monitor symptoms for any change in severity if there is any escalation of current symptoms or development of new symptoms follow-up in ER for further evaluation and management.

## 2025-03-25 ENCOUNTER — Encounter (HOSPITAL_BASED_OUTPATIENT_CLINIC_OR_DEPARTMENT_OTHER): Admitting: Family Medicine
